# Patient Record
Sex: Male | Born: 1990 | Race: Black or African American | Hispanic: No | Marital: Married | Smoking: Never smoker
Health system: Southern US, Community
[De-identification: ages and names within clinical notes are randomized; demographics above are authoritative.]

## PROBLEM LIST (undated history)

## (undated) DIAGNOSIS — M1990 Unspecified osteoarthritis, unspecified site: Secondary | ICD-10-CM

## (undated) DIAGNOSIS — Z8669 Personal history of other diseases of the nervous system and sense organs: Secondary | ICD-10-CM

## (undated) DIAGNOSIS — J45909 Unspecified asthma, uncomplicated: Secondary | ICD-10-CM

## (undated) HISTORY — DX: Personal history of other diseases of the nervous system and sense organs: Z86.69

---

## 2016-10-19 ENCOUNTER — Encounter (HOSPITAL_COMMUNITY): Payer: Self-pay

## 2016-10-19 ENCOUNTER — Emergency Department (HOSPITAL_COMMUNITY)
Admission: EM | Admit: 2016-10-19 | Discharge: 2016-10-19 | Disposition: A | Discharge status: Home / Self Care | Payer: 59 | Attending: Emergency Medicine | Admitting: Emergency Medicine

## 2016-10-19 DIAGNOSIS — Z79899 Other long term (current) drug therapy: Secondary | ICD-10-CM | POA: Insufficient documentation

## 2016-10-19 DIAGNOSIS — F17200 Nicotine dependence, unspecified, uncomplicated: Secondary | ICD-10-CM | POA: Diagnosis not present

## 2016-10-19 DIAGNOSIS — J45909 Unspecified asthma, uncomplicated: Secondary | ICD-10-CM | POA: Diagnosis not present

## 2016-10-19 DIAGNOSIS — R2231 Localized swelling, mass and lump, right upper limb: Secondary | ICD-10-CM | POA: Diagnosis present

## 2016-10-19 DIAGNOSIS — D367 Benign neoplasm of other specified sites: Secondary | ICD-10-CM

## 2016-10-19 DIAGNOSIS — L720 Epidermal cyst: Secondary | ICD-10-CM | POA: Diagnosis not present

## 2016-10-19 HISTORY — DX: Unspecified osteoarthritis, unspecified site: M19.90

## 2016-10-19 HISTORY — DX: Unspecified asthma, uncomplicated: J45.909

## 2016-10-19 NOTE — ED Triage Notes (Signed)
Pt states he has a lump in his right bicep area. Pt reports he does heavy weight lifting. Lump is painless. No redness or swelling noted. Radial pulse intact.

## 2016-10-19 NOTE — ED Provider Notes (Signed)
[facility] DEPT Provider Note   CSN: [id number] Arrival date & time: 10/19/16  1815  By signing my name below, I, Bryan Benjamin, attest that this documentation has been prepared under the direction and in the presence of non-physician practitioner, [facility] M. Janit Bern, NP. Electronically Signed: Dora Benjamin, Scribe. 10/19/2016. 7:34 PM.  History   Chief Complaint Chief Complaint  Patient presents with  . Mass    The history is provided by the patient. No language interpreter was used.     HPI Comments: Bryan Benjamin is a 26 y.o. male who presents to the Emergency Department complaining of a mass in his right bicep beginning three weeks ago. Patient reports he initially noticed the mass while stretching his right arm and it has been present since. He states the mass is not painful but does endorse some "tightness" around the mass. He states the sensation of tightness extends into his right forearm. Patient has been weightlifting regularly since he noticed his mass without any complications. No changes in ROM of right arm. No h/o the same. No h/o cysts. No IVDU. Pt denies redness, swelling, numbness/tingling, drainage from the area, or any other associated symptoms. No PCP.  Past Medical History:  Diagnosis Date  . Arthritis   . Asthma     There are no active problems to display for this patient.   History reviewed. No pertinent surgical history.     Home Medications    Prior to Admission medications   Not on File    Family History History reviewed. No pertinent family history.  Social History Social History  Substance Use Topics  . Smoking status: Light Tobacco Smoker  . Smokeless tobacco: Never Used     Comment: rare  . Alcohol use Yes     Comment: rare     Allergies   Patient has no known allergies.   Review of Systems Review of Systems  Constitutional: Negative for chills and fever.  HENT: Negative.   Eyes: Negative for redness.  Respiratory: Negative  for shortness of breath.   Cardiovascular: Negative for chest pain.  Gastrointestinal: Negative for abdominal pain.  Musculoskeletal: Negative for arthralgias, joint swelling and myalgias.       Area to right upper arm that is raised but not painful.  Skin: Negative for color change.  Neurological: Negative for numbness.  Hematological: Negative for adenopathy.  Psychiatric/Behavioral: The patient is not nervous/anxious.    Physical Exam Updated Vital Signs Pulse 75   Temp 98.5 F (36.9 C)   Resp 18   SpO2 97%   Physical Exam  Constitutional: He is oriented to person, place, and time. He appears well-developed and well-nourished. No distress.  HENT:  Head: Normocephalic and atraumatic.  Eyes: Conjunctivae are normal.  Neck: Neck supple.  Cardiovascular: Normal rate.   Pulses:      Radial pulses are 2+ on the right side, and 2+ on the left side.  Pulmonary/Chest: Effort normal. No respiratory distress.  Musculoskeletal: Normal range of motion.  4 x 3 cm raised cystic area to the distal right biceps. No tenderness on palpation.  Neurological: He is alert and oriented to person, place, and time. He has normal reflexes.  Reflex Scores:      Bicep reflexes are 2+ on the right side and 2+ on the left side.      Brachioradialis reflexes are 2+ on the right side and 2+ on the left side. Skin: Skin is warm and dry.  Psychiatric: He has a normal  mood and affect. His behavior is normal.  Nursing note and vitals reviewed.  ED Treatments / Results  Labs (all labs ordered are listed, but only abnormal results are displayed) Labs Reviewed - No data to display   Radiology No results found.  Procedures Procedures (including critical care time)  DIAGNOSTIC STUDIES: Oxygen Saturation is 97% on RA, normal by my interpretation.    COORDINATION OF CARE: 7:43 PM Discussed treatment plan with pt at bedside and pt agreed to plan.  Medications Ordered in ED Medications - No data to  display   Initial Impression / Assessment and Plan / ED Course  I have reviewed the triage vital signs and the nursing notes.  Final Clinical Impressions(s) / ED Diagnoses  26 y.o. male with cystic area to the right upper arm stable for d/c without fever, pain, or other signs of infection. Patient given referral to dermatology for f/u.   Final diagnoses:  Cyst, dermoid, arm, right    New Prescriptions There are no discharge medications for this patient.  I personally performed the services described in this documentation, which was scribed in my presence. The recorded information has been reviewed and is accurate.    [address] [place], [place] 10/21/16 [place], MD 10/23/16 [phone]

## 2016-10-19 NOTE — Discharge Instructions (Signed)
Your exam today shows that you have a cystic area to the right upper arm. Call Dr. Juel Burrow office to schedule a follow up appointment for further evaluation of the area. If the area becomes red, you develop fever, red steaks or other problems, return here.

## 2019-06-15 ENCOUNTER — Other Ambulatory Visit: Payer: Self-pay

## 2019-06-15 ENCOUNTER — Ambulatory Visit: Payer: 59 | Admitting: Family Medicine

## 2019-06-15 ENCOUNTER — Encounter: Payer: Self-pay | Admitting: Family Medicine

## 2019-06-15 VITALS — BP 122/82 | HR 53 | Temp 97.2°F | Ht 71.5 in | Wt 249.8 lb

## 2019-06-15 DIAGNOSIS — Z113 Encounter for screening for infections with a predominantly sexual mode of transmission: Secondary | ICD-10-CM | POA: Diagnosis not present

## 2019-06-15 DIAGNOSIS — J302 Other seasonal allergic rhinitis: Secondary | ICD-10-CM

## 2019-06-15 DIAGNOSIS — G510 Bell's palsy: Secondary | ICD-10-CM | POA: Diagnosis not present

## 2019-06-15 NOTE — Patient Instructions (Signed)
Bell Palsy, Adult  Bell palsy is a short-term inability to move muscles in part of the face. The inability to move (paralysis) results from inflammation or compression of the facial nerve, which travels along the skull and under the ear to the side of the face (7th cranial nerve). This nerve is responsible for facial movements that include blinking, closing the eyes, smiling, and frowning. What are the causes? The exact cause of this condition is not known. It may be caused by an infection from a virus, such as the chickenpox (herpes zoster), Epstein-Barr, or mumps virus. What increases the risk? You are more likely to develop this condition if:  You are pregnant.  You have diabetes.  You have had a recent infection in your nose, throat, or airways (upper respiratory infection).  You have a weakened body defense system (immune system).  You have had a facial injury, such as a fracture.  You have a family history of Bell palsy. What are the signs or symptoms? Symptoms of this condition include:  Weakness on one side of the face.  Drooping eyelid and corner of the mouth.  Excessive tearing in one eye.  Difficulty closing the eyelid.  Dry eye.  Drooling.  Dry mouth.  Changes in taste.  Change in facial appearance.  Pain behind one ear.  Ringing in one or both ears.  Sensitivity to sound in one ear.  Facial twitching.  Headache.  Impaired speech.  Dizziness.  Difficulty eating or drinking. Most of the time, only one side of the face is affected. Rarely, Bell palsy affects the whole face. How is this diagnosed? This condition is diagnosed based on:  Your symptoms.  Your medical history.  A physical exam. You may also have to see health care providers who specialize in disorders of the nerves (neurologist) or diseases and conditions of the eye (ophthalmologist). You may have tests, such as:  A test to check for nerve damage (electromyogram).  Imaging  studies, such as CT or MRI scans.  Blood tests. How is this treated? This condition affects every person differently. Sometimes symptoms go away without treatment within a couple weeks. If treatment is needed, it varies from person to person. The goal of treatment is to reduce inflammation and protect the eye from damage. Treatment for Bell palsy may include:  Medicines, such as: ? Steroids to reduce swelling and inflammation. ? Antiviral drugs. ? Pain relievers, including aspirin, acetaminophen, or ibuprofen.  Eye drops or ointment to keep your eye moist.  Eye protection, if you cannot close your eye.  Exercises or massage to regain muscle strength and function (physical therapy). Follow these instructions at home:   Take over-the-counter and prescription medicines only as told by your health care provider.  If your eye is affected: ? Keep your eye moist with eye drops or ointment as told by your health care provider. ? Follow instructions for eye care and protection as told by your health care provider.  Do any physical therapy exercises as told by your health care provider.  Keep all follow-up visits as told by your health care provider. This is important. Contact a health care provider if:  You have a fever.  Your symptoms do not get better within 2-3 weeks, or your symptoms get worse.  Your eye is red, irritated, or painful.  You have new symptoms. Get help right away if:  You have weakness or numbness in a part of your body other than your face.  You have   trouble swallowing.  You develop neck pain or stiffness.  You develop dizziness or shortness of breath. Summary  Bell palsy is a short-term inability to move muscles in part of the face. The inability to move (paralysis) results from inflammation or compression of the facial nerve.  This condition affects every person differently. Sometimes symptoms go away without treatment within a couple weeks.  If  treatment is needed, it varies from person to person. The goal of treatment is to reduce inflammation and protect the eye from damage.  Contact your health care provider if your symptoms do not get better within 2-3 weeks, or your symptoms get worse. This information is not intended to replace advice given to you by your health care provider. Make sure you discuss any questions you have with your health care provider. Document Released: 06/15/2005 Document Revised: 05/28/2017 Document Reviewed: 08/18/2016 Elsevier Patient Education  2020 [employer].

## 2019-06-15 NOTE — Progress Notes (Signed)
Subjective:    Patient ID: Bryan Benjamin, male    DOB: 10/10/1990, 28 y.o.   MRN: [id number]  HPI Chief Complaint  Patient presents with  . new pt    new pt get est, thinks he might bell's palsy. started developed 2 weeks ago, droopling, swollen lymphs, pain, went to ER was given medrol dosepak but they didn't know what was wrong   He is new to the practice. Here with complaints of a 2 week history of right sided facial paralysis that seems to be improving. He has also had right ear pain and pain to the right side of his face and head. He is no longer needing medication and states pain is improving.  States he went to an UC after having enlarged lymph nodes on the right side of his neck and what he thought was his usual allergies. He was prescribed oral steroids which he completed.  States he started having issues closing his eye and was unable to move the muscles in his forehead, right nostril or the right side of his mouth. States his lymph nodes are no longer painful or swollen.  Reports his right eye is closing better and clear drainage is not as bad. He has been using drops to keep his eye moist.  States he has been doing research to figure out what is going on with his symptoms.     He denies ever having a rash.   Denies fever, chills, night sweats, dizziness, vision changes, sore throat, chest pain, palpitations, cough, shortness of breath, abdominal pain, N/V/D.   Allergies to ragweed and pollen. Takes allergy medication as needed.  Childhood asthma. No issues as an adult.   Denies any other significant PMH or PSH.  Does not eat pork.   He smokes marihuana daily. No cigarette smoking. Rarely drinks alcohol.  Married with one 30 year old.  Works in Engineer, technical sales.   Reviewed allergies, medications, past medical, surgical, family, and social history.  Review of Systems Pertinent positives and negatives in the history of present illness.     Objective:   Physical Exam Constitutional:      General: He is not in acute distress.    Appearance: He is not ill-appearing.  HENT:     Right Ear: Tympanic membrane, ear canal and external ear normal. No mastoid tenderness.     Left Ear: Tympanic membrane, ear canal and external ear normal.     Mouth/Throat:     Lips: Pink.     Mouth: Mucous membranes are moist. No oral lesions.     Tongue: No lesions. Tongue does not deviate from midline.     Pharynx: Oropharynx is clear. Uvula midline.  Eyes:     General: Vision grossly intact. Gaze aligned appropriately.     Extraocular Movements: Extraocular movements intact.     Conjunctiva/sclera: Conjunctivae normal.     Comments: Mild right upper eye lid lag  Neck:     Thyroid: No thyromegaly.     Trachea: Trachea and phonation normal.  Cardiovascular:     Rate and Rhythm: Normal rate.     Pulses: Normal pulses.     Heart sounds: Normal heart sounds.  Pulmonary:     Effort: Pulmonary effort is normal.     Breath sounds: Normal breath sounds.  Musculoskeletal:     Cervical back: Normal range of motion and neck supple. No tenderness.     Right lower leg: No edema.     Left lower leg:  No edema.  Lymphadenopathy:     Cervical: No cervical adenopathy.     Right cervical: No superficial cervical adenopathy.    Left cervical: No superficial cervical adenopathy.  Skin:    General: Skin is warm and dry.     Capillary Refill: Capillary refill takes less than 2 seconds.     Coloration: Skin is not pale.     Findings: No rash.  Neurological:     Mental Status: He is alert and oriented to person, place, and time.     Sensory: Sensation is intact.     Gait: Gait is intact.     Comments: Right sided facial paralysis including inability to raise  His right eyebrow, no wrinkles to right forehead, right sided mouth droop with right eyelid lag which is mild   Psychiatric:        Attention and Perception: Attention normal.        Mood and Affect: Mood normal.        Speech: Speech normal.          Behavior: Behavior normal.        Thought Content: Thought content normal.        Cognition and Memory: Cognition normal.    BP 122/82   Pulse (!) 53   Temp (!) 97.2 F (36.2 C)   Ht 5' 11.5" (1.816 m)   Wt 249 lb 12.8 oz (113.3 kg)   BMI 34.35 kg/m       Assessment & Plan:  Facial paralysis/Bells palsy - Plan: CBC with Differential, Comprehensive metabolic panel, HIV antibody (with reflex), RPR, GC/Chlamydia Probe Amp  Screen for STD (sexually transmitted disease) - Plan: HIV antibody (with reflex), RPR, GC/Chlamydia Probe Amp  Seasonal allergies  He is a pleasant 28 year old male who is new to the practice and here today to establish care. He presents with a 2 week history of gradually improving symptoms which appear to be consistent with Bells palsy. Dr. Redmond School also examined patient. Discussed that I expect his symptoms to continue to improve with time. He is no longer needed pain medication.  Discussed unclear as to why he developed this condition and I recommend labs to look for an underlying immune issue. He agrees. We will do a full screening for STDs.  Advised him to follow up if worsening or if he does not continue to improve. He may return at his convenience otherwise for a CPE since he is overdue.

## 2019-06-16 LAB — COMPREHENSIVE METABOLIC PANEL
ALT: 61 IU/L — ABNORMAL HIGH (ref 0–44)
AST: 25 IU/L (ref 0–40)
Albumin/Globulin Ratio: 1.6 (ref 1.2–2.2)
Albumin: 4.4 g/dL (ref 4.1–5.2)
Alkaline Phosphatase: 59 IU/L (ref 39–117)
BUN/Creatinine Ratio: 6 — ABNORMAL LOW (ref 9–20)
BUN: 8 mg/dL (ref 6–20)
Bilirubin Total: 0.4 mg/dL (ref 0.0–1.2)
CO2: 26 mmol/L (ref 20–29)
Calcium: 9.5 mg/dL (ref 8.7–10.2)
Chloride: 100 mmol/L (ref 96–106)
Creatinine, Ser: 1.26 mg/dL (ref 0.76–1.27)
GFR calc Af Amer: 88 mL/min/{1.73_m2} (ref >59)
GFR calc non Af Amer: 76 mL/min/{1.73_m2} (ref >59)
Globulin, Total: 2.7 g/dL (ref 1.5–4.5)
Glucose: 87 mg/dL (ref 65–99)
Potassium: 4.3 mmol/L (ref 3.5–5.2)
Sodium: 137 mmol/L (ref 134–144)
Total Protein: 7.1 g/dL (ref 6.0–8.5)

## 2019-06-16 LAB — CBC WITH DIFFERENTIAL/PLATELET
Basophils Absolute: 0 10*3/uL (ref 0.0–0.2)
Basos: 0 %
EOS (ABSOLUTE): 0.1 10*3/uL (ref 0.0–0.4)
Eos: 1 %
Hematocrit: 45.2 % (ref 37.5–51.0)
Hemoglobin: 15.4 g/dL (ref 13.0–17.7)
Immature Grans (Abs): 0 10*3/uL (ref 0.0–0.1)
Immature Granulocytes: 0 %
Lymphocytes Absolute: 2.6 10*3/uL (ref 0.7–3.1)
Lymphs: 49 %
MCH: 26.7 pg (ref 26.6–33.0)
MCHC: 34.1 g/dL (ref 31.5–35.7)
MCV: 78 fL — ABNORMAL LOW (ref 79–97)
Monocytes Absolute: 0.4 10*3/uL (ref 0.1–0.9)
Monocytes: 7 %
Neutrophils Absolute: 2.4 10*3/uL (ref 1.4–7.0)
Neutrophils: 43 %
Platelets: 307 10*3/uL (ref 150–450)
RBC: 5.77 x10E6/uL (ref 4.14–5.80)
RDW: 14.6 % (ref 11.6–15.4)
WBC: 5.5 10*3/uL (ref 3.4–10.8)

## 2019-06-16 LAB — HIV ANTIBODY (ROUTINE TESTING W REFLEX): HIV Screen 4th Generation wRfx: NONREACTIVE

## 2019-06-16 LAB — RPR: RPR Ser Ql: NONREACTIVE

## 2019-06-16 LAB — GC/CHLAMYDIA PROBE AMP
Chlamydia trachomatis, NAA: NEGATIVE
Neisseria Gonorrhoeae by PCR: NEGATIVE

## 2019-06-19 ENCOUNTER — Other Ambulatory Visit: Payer: Self-pay | Admitting: Internal Medicine

## 2019-06-19 DIAGNOSIS — R748 Abnormal levels of other serum enzymes: Secondary | ICD-10-CM

## 2019-06-27 ENCOUNTER — Other Ambulatory Visit: Payer: Self-pay | Admitting: Internal Medicine

## 2019-06-27 ENCOUNTER — Other Ambulatory Visit: Payer: 59

## 2019-06-27 ENCOUNTER — Other Ambulatory Visit: Payer: Self-pay

## 2019-06-27 DIAGNOSIS — R748 Abnormal levels of other serum enzymes: Secondary | ICD-10-CM

## 2019-06-28 LAB — COMPREHENSIVE METABOLIC PANEL
ALT: 51 IU/L — ABNORMAL HIGH (ref 0–44)
AST: 21 IU/L (ref 0–40)
Albumin/Globulin Ratio: 1.9 (ref 1.2–2.2)
Albumin: 4.6 g/dL (ref 4.1–5.2)
Alkaline Phosphatase: 59 IU/L (ref 39–117)
BUN/Creatinine Ratio: 4 — ABNORMAL LOW (ref 9–20)
BUN: 6 mg/dL (ref 6–20)
Bilirubin Total: 0.4 mg/dL (ref 0.0–1.2)
CO2: 23 mmol/L (ref 20–29)
Calcium: 9.7 mg/dL (ref 8.7–10.2)
Chloride: 100 mmol/L (ref 96–106)
Creatinine, Ser: 1.36 mg/dL — ABNORMAL HIGH (ref 0.76–1.27)
GFR calc Af Amer: 80 mL/min/{1.73_m2} (ref >59)
GFR calc non Af Amer: 69 mL/min/{1.73_m2} (ref >59)
Globulin, Total: 2.4 g/dL (ref 1.5–4.5)
Glucose: 83 mg/dL (ref 65–99)
Potassium: 4.6 mmol/L (ref 3.5–5.2)
Sodium: 139 mmol/L (ref 134–144)
Total Protein: 7 g/dL (ref 6.0–8.5)

## 2019-06-28 NOTE — Progress Notes (Signed)
His liver and kidney functions are mildly elevated but his liver function has improved slightly. How are his Bells Palsy symptoms? Improving? We will recheck his labs at his follow up appointment. If he is not continuing to improve or if he is having any new symptoms, we can get him in sooner.

## 2019-07-08 LAB — HEPATITIS PANEL, ACUTE
Hep A IgM: NEGATIVE
Hep B C IgM: NEGATIVE
Hep C Virus Ab: <0.1 s/co ratio (ref 0.0–0.9)
Hepatitis B Surface Ag: NEGATIVE

## 2019-07-08 LAB — SPECIMEN STATUS REPORT

## 2019-07-18 DIAGNOSIS — R748 Abnormal levels of other serum enzymes: Secondary | ICD-10-CM | POA: Insufficient documentation

## 2019-07-18 DIAGNOSIS — Z8669 Personal history of other diseases of the nervous system and sense organs: Secondary | ICD-10-CM | POA: Insufficient documentation

## 2019-07-18 DIAGNOSIS — R7989 Other specified abnormal findings of blood chemistry: Secondary | ICD-10-CM | POA: Insufficient documentation

## 2019-07-18 NOTE — Progress Notes (Deleted)
   Subjective:    Patient ID: Bryan Benjamin, male    DOB: 04-Oct-1990, 29 y.o.   MRN: [id number]  HPI No chief complaint on file.  He is fairly new to the practice and here for a complete physical exam. Previous medical care: Last CPE:  Other providers:  Past medical history: Surgeries:  Family history: Mental health history:  Social history: Lives with ***, works as ***,  *** Smoking, drinking alcohol, drug use Diet: *** Exercise: ***  Immunizations:  Health maintenance:  Colonoscopy: Last PSA: Last Dental Exam: Last Eye Exam:  Wears seatbelt always, uses sunscreen, smoke detectors in home and functioning, does not text while driving, feels safe in home environment.  Reviewed allergies, medications, past medical, surgical, family, and social history.   Review of Systems Review of Systems Constitutional: -fever, -chills, -sweats, -unexpected weight change,-fatigue ENT: -runny nose, -ear pain, -sore throat Cardiology:  -chest pain, -palpitations, -edema Respiratory: -cough, -shortness of breath, -wheezing Gastroenterology: -abdominal pain, -nausea, -vomiting, -diarrhea, -constipation  Hematology: -bleeding or bruising problems Musculoskeletal: -arthralgias, -myalgias, -joint swelling, -back pain Ophthalmology: -vision changes Urology: -dysuria, -difficulty urinating, -hematuria, -urinary frequency, -urgency Neurology: -headache, -weakness, -tingling, -numbness       Objective:   Physical Exam There were no vitals taken for this visit.  General Appearance:    Alert, cooperative, no distress, appears stated age  Head:    Normocephalic, without obvious abnormality, atraumatic  Eyes:    PERRL, conjunctiva/corneas clear, EOM's intact, fundi    benign  Ears:    Normal TM's and external ear canals  Nose:   Nares normal, mucosa normal, no drainage or sinus   tenderness  Throat:   Lips, mucosa, and tongue normal; teeth and gums normal  Neck:   Supple, no  lymphadenopathy;  thyroid:  no   enlargement/tenderness/nodules; no carotid   bruit or JVD  Back:    Spine nontender, no curvature, ROM normal, no CVA     tenderness  Lungs:     Clear to auscultation bilaterally without wheezes, rales or     ronchi; respirations unlabored  Chest Wall:    No tenderness or deformity   Heart:    Regular rate and rhythm, S1 and S2 normal, no murmur, rub   or gallop  Breast Exam:    No chest wall tenderness, masses or gynecomastia  Abdomen:     Soft, non-tender, nondistended, normoactive bowel sounds,    no masses, no hepatosplenomegaly  Genitalia:    Normal male external genitalia without lesions.  Testicles without masses.  No inguinal hernias.  Rectal:   Deferred due to age <40 and lack of symptoms  Extremities:   No clubbing, cyanosis or edema  Pulses:   2+ and symmetric all extremities  Skin:   Skin color, texture, turgor normal, no rashes or lesions  Lymph nodes:   Cervical, supraclavicular, and axillary nodes normal  Neurologic:   CNII-XII intact, normal strength, sensation and gait; reflexes 2+ and symmetric throughout          Psych:   Normal mood, affect, hygiene and grooming.         Assessment & Plan:  Routine general medical examination at a health care facility  Elevated liver enzymes  Elevated serum creatinine  H/O Bell's palsy

## 2019-07-19 ENCOUNTER — Encounter: Payer: 59 | Admitting: Family Medicine

## 2019-08-02 ENCOUNTER — Encounter: Payer: Self-pay | Admitting: Family Medicine

## 2019-08-02 ENCOUNTER — Other Ambulatory Visit: Payer: Self-pay

## 2019-08-02 ENCOUNTER — Ambulatory Visit (INDEPENDENT_AMBULATORY_CARE_PROVIDER_SITE_OTHER): Payer: 59 | Admitting: Family Medicine

## 2019-08-02 VITALS — BP 124/88 | HR 77 | Ht 71.5 in | Wt 242.5 lb

## 2019-08-02 DIAGNOSIS — Z8669 Personal history of other diseases of the nervous system and sense organs: Secondary | ICD-10-CM | POA: Diagnosis not present

## 2019-08-02 DIAGNOSIS — Z0000 Encounter for general adult medical examination without abnormal findings: Secondary | ICD-10-CM

## 2019-08-02 DIAGNOSIS — Z13220 Encounter for screening for lipoid disorders: Secondary | ICD-10-CM | POA: Diagnosis not present

## 2019-08-02 DIAGNOSIS — R748 Abnormal levels of other serum enzymes: Secondary | ICD-10-CM | POA: Diagnosis not present

## 2019-08-02 DIAGNOSIS — M7989 Other specified soft tissue disorders: Secondary | ICD-10-CM

## 2019-08-02 NOTE — Patient Instructions (Addendum)
Dermatology offices  [facility]: Phone #: [phone] Address: [address], [place], [place] [address]  [facility]: Phone: [phone]  Address: [address] [place], [place] [address]  [facility] Address: [address] [place], [place], [place] [address] Phone: [phone]  You can call and schedule your Dentist appointment at any of the following offices:   [facility] Address: [address]  [place], [place] [address] Phone #: [phone]  [facility] Address: [address] [place], [place] [address] Phone # [phone]  [facility]  Address: [address]                                                                 [place], [place] [address] Phone #: [phone]    Preventive Care 18-72 Years Old, Male Preventive care refers to lifestyle choices and visits with your health care provider that can promote health and wellness. This includes:  A yearly physical exam. This is also called an annual well check.  Regular dental and eye exams.  Immunizations.  Screening for certain conditions.  Healthy lifestyle choices, such as eating a healthy diet, getting regular exercise, not using drugs or products that contain nicotine and tobacco, and limiting alcohol use. What can I expect for my preventive care visit? Physical exam Your health care provider will check:  Height and weight. These may be used to calculate body mass index (BMI), which is a measurement that tells if you are at a healthy weight.  Heart rate and blood pressure.  Your skin for abnormal spots. Counseling Your health care provider may ask you questions about:  Alcohol, tobacco, and drug use.  Emotional well-being.  Home and relationship well-being.  Sexual activity.  Eating habits.  Work and work Statistician. What immunizations do I need?  Influenza (flu)  vaccine  This is recommended every year. Tetanus, diphtheria, and pertussis (Tdap) vaccine  You may need a Td booster every 10 years. Varicella (chickenpox) vaccine  You may need this vaccine if you have not already been vaccinated. Human papillomavirus (HPV) vaccine  If recommended by your health care provider, you may need three doses over 6 months. Measles, mumps, and rubella (MMR) vaccine  You may need at least one dose of MMR. You may also need a second dose. Meningococcal conjugate (MenACWY) vaccine  One dose is recommended if you are 48-20 years old and a Market researcher living in a residence hall, or if you have one of several medical conditions. You may also need additional booster doses. Pneumococcal conjugate (PCV13) vaccine  You may need this if you have certain conditions and were not previously vaccinated. Pneumococcal polysaccharide (PPSV23) vaccine  You may need one or two doses if you smoke cigarettes or if you have certain conditions. Hepatitis A vaccine  You may need this if you have certain conditions or if you travel or work in places where you may be exposed to hepatitis A. Hepatitis B vaccine  You may need this if you have certain conditions or if you travel or work in places where you may be exposed to hepatitis B. Haemophilus influenzae type b (Hib) vaccine  You may need this if you have certain risk factors.  You may receive vaccines as individual doses or as more than one vaccine together in one shot (combination vaccines). Talk with your health care provider about the risks and benefits of combination vaccines. What tests do I need? Blood tests  Lipid and cholesterol levels. These may be checked every 5 years starting at age 82.  Hepatitis C test.  Hepatitis B test. Screening   Diabetes screening. This is done by checking your blood sugar (glucose) after you have not eaten for a while (fasting).  Sexually transmitted disease (STD)  testing. Talk with your health care provider about your test results, treatment options, and if necessary, the need for more tests. Follow these instructions at home: Eating and drinking   Eat a diet that includes fresh fruits and vegetables, whole grains, lean protein, and low-fat dairy products.  Take vitamin and mineral supplements as recommended by your health care provider.  Do not drink alcohol if your health care provider tells you not to drink.  If you drink alcohol: ? Limit how much you have to 0-2 drinks a day. ? Be aware of how much alcohol is in your drink. In the [place] one drink equals one 12 oz bottle of beer (355 mL), one 5 oz glass of wine (148 mL), or one 1 oz glass of hard liquor (44 mL). Lifestyle  Take daily care of your teeth and gums.  Stay active. Exercise for at least 30 minutes on 5 or more days each week.  Do not use any products that contain nicotine or tobacco, such as cigarettes, e-cigarettes, and chewing tobacco. If you need help quitting, ask your health care provider.  If you are sexually active, practice safe sex. Use a condom or other form of protection to prevent STIs (sexually transmitted infections). What's next?  Go to your health care provider once a year for a well check visit.  Ask your health care provider how often you should have your eyes and teeth checked.  Stay up to date on all vaccines. This information is not intended to replace advice given to you by your health care provider. Make sure you discuss any questions you have with your health care provider. Document Revised: 06/09/2018 Document Reviewed: 06/09/2018 Elsevier Patient Education  2020 [employer].

## 2019-08-02 NOTE — Progress Notes (Addendum)
Subjective:    Patient ID: Bryan Benjamin, male    DOB: September 29, 1990, 29 y.o.   MRN: [id number]  HPI Chief Complaint  Patient presents with  . fasting cpe    fasting cpe, no other concerns   He is fairly new to the practice and here for a complete physical exam. Last CPE: years ago   Complains of a mass on his right upper arm that he would like to do something about. States it is not painful but is more of a cosmetic issue.   Bells Palsy - states he is basically back to baseline.   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   Elevated serum creatinine at his previous visit as well as elevated liver enzyme and negative acute hepatitis panel.  No regular alcohol use but states he made a rum cake 2 days ago. Has been eating it. No NSAIDs.   Reports hx of asthma and no issues since age 7.    He smokes marijuana daily. No cigarette smoking. Rarely drinks alcohol.  Married with one 31 year old.  Works in Engineer, technical sales.   Diet: healthy. Does not eat pork.  Exercise: 6 days per week   Immunizations: declines   Health maintenance:  Colonoscopy: N/A Last PSA: N/A Last Dental Exam: years ago Last Eye Exam:  Years ago   Wears seatbelt always, smoke detectors in home and functioning, does not text while driving, feels safe in home environment.  Reviewed allergies, medications, past medical, surgical, family, and social history.    Review of Systems Review of Systems Constitutional: -fever, -chills, -sweats, -unexpected weight change,-fatigue ENT: -runny nose, -ear pain, -sore throat Cardiology:  -chest pain, -palpitations, -edema Respiratory: -cough, -shortness of breath, -wheezing Gastroenterology: -abdominal pain, -nausea, -vomiting, -diarrhea, -constipation  Hematology: -bleeding or bruising problems Musculoskeletal: -arthralgias, -myalgias, -joint swelling, -back pain Ophthalmology: -vision changes Urology: -dysuria, -difficulty urinating, -hematuria, -urinary frequency, -urgency Neurology:  -headache, -weakness, -tingling, -numbness       Objective:   Physical Exam BP 124/88   Pulse 77   Ht 5' 11.5" (1.816 m)   Wt 242 lb 8 oz (110 kg)   BMI 33.35 kg/m   General Appearance:    Alert, cooperative, no distress, appears stated age  Head:    Normocephalic, without obvious abnormality, atraumatic  Eyes:    PERRL, conjunctiva/corneas clear, EOM's intact  Ears:    Normal TM's and external ear canals  Nose:   Mask in place   Throat:   Mask in place   Neck:   Supple, no lymphadenopathy;  thyroid:  no   enlargement/tenderness/nodules; no JVD  Back:    Spine nontender, no curvature, ROM normal, no CVA     tenderness  Lungs:     Clear to auscultation bilaterally without wheezes, rales or     ronchi; respirations unlabored  Chest Wall:    No tenderness or deformity   Heart:    Regular rate and rhythm, S1 and S2 normal, no murmur, rub   or gallop  Breast Exam:    No chest wall tenderness, masses or gynecomastia  Abdomen:     Soft, non-tender, nondistended, normoactive bowel sounds,    no masses, no hepatosplenomegaly  Genitalia:    Normal male external genitalia without lesions.  Testicles without masses.  No inguinal hernias.  Rectal:   Deferred due to age <40 and lack of symptoms  Extremities:   No clubbing, cyanosis or edema  Pulses:   2+ and symmetric all extremities  Skin:  Skin color, texture, turgor normal, no rashes. He has a large raised soft and somewhat moveable mass on his anterior distal bicep just superior to the medial antecubital space.   Lymph nodes:   Cervical, supraclavicular, and axillary nodes normal  Neurologic:   CNII-XII intact, normal strength, sensation and gait; reflexes 2+ and symmetric throughout          Psych:   Normal mood, affect, hygiene and grooming.         Assessment & Plan:  Routine general medical examination at a health care facility - Plan: CBC with Differential/Platelet, Comprehensive metabolic panel, TSH, T4, free, Lipid panel -Here  today for a fasting CPE.  Discussed preventive healthcare and cancer screening particularly testicular cancer.  Immunizations reviewed.  Declines vaccines.  Discussed healthy lifestyle including healthy diet and exercise.  Advised stopping marijuana use.  H/O Bell's palsy -Reports being basically back to baseline  Elevated liver enzymes - Plan: Comprehensive metabolic panel -No regular alcohol or NSAID use.  Discussed that if his ALT is still elevated that I will order an ultrasound and he is okay with this.   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 Will refer to general surgery since he wants to have it removed.   Screening for lipid disorders - Plan: Lipid panel -Counseled on healthy diet and exercise.  Follow-up pending lab

## 2019-08-03 ENCOUNTER — Other Ambulatory Visit: Payer: Self-pay | Admitting: Internal Medicine

## 2019-08-03 DIAGNOSIS — M7989 Other specified soft tissue disorders: Secondary | ICD-10-CM

## 2019-08-03 DIAGNOSIS — R748 Abnormal levels of other serum enzymes: Secondary | ICD-10-CM

## 2019-08-03 LAB — LIPID PANEL
Chol/HDL Ratio: 4.4 ratio (ref 0.0–5.0)
Cholesterol, Total: 209 mg/dL — ABNORMAL HIGH (ref 100–199)
HDL: 47 mg/dL (ref >39)
LDL Chol Calc (NIH): 148 mg/dL — ABNORMAL HIGH (ref 0–99)
Triglycerides: 76 mg/dL (ref 0–149)
VLDL Cholesterol Cal: 14 mg/dL (ref 5–40)

## 2019-08-03 LAB — CBC WITH DIFFERENTIAL/PLATELET
Basophils Absolute: 0 10*3/uL (ref 0.0–0.2)
Basos: 0 %
EOS (ABSOLUTE): 0.1 10*3/uL (ref 0.0–0.4)
Eos: 1 %
Hematocrit: 47.8 % (ref 37.5–51.0)
Hemoglobin: 16.1 g/dL (ref 13.0–17.7)
Immature Grans (Abs): 0 10*3/uL (ref 0.0–0.1)
Immature Granulocytes: 0 %
Lymphocytes Absolute: 2.6 10*3/uL (ref 0.7–3.1)
Lymphs: 52 %
MCH: 26.7 pg (ref 26.6–33.0)
MCHC: 33.7 g/dL (ref 31.5–35.7)
MCV: 79 fL (ref 79–97)
Monocytes Absolute: 0.3 10*3/uL (ref 0.1–0.9)
Monocytes: 7 %
Neutrophils Absolute: 2 10*3/uL (ref 1.4–7.0)
Neutrophils: 40 %
Platelets: 266 10*3/uL (ref 150–450)
RBC: 6.04 x10E6/uL — ABNORMAL HIGH (ref 4.14–5.80)
RDW: 14.6 % (ref 11.6–15.4)
WBC: 5.1 10*3/uL (ref 3.4–10.8)

## 2019-08-03 LAB — COMPREHENSIVE METABOLIC PANEL
ALT: 53 IU/L — ABNORMAL HIGH (ref 0–44)
AST: 25 IU/L (ref 0–40)
Albumin/Globulin Ratio: 1.8 (ref 1.2–2.2)
Albumin: 4.5 g/dL (ref 4.0–5.0)
Alkaline Phosphatase: 56 IU/L (ref 39–117)
BUN/Creatinine Ratio: 9 (ref 9–20)
BUN: 10 mg/dL (ref 6–20)
Bilirubin Total: 0.3 mg/dL (ref 0.0–1.2)
CO2: 23 mmol/L (ref 20–29)
Calcium: 9.2 mg/dL (ref 8.7–10.2)
Chloride: 103 mmol/L (ref 96–106)
Creatinine, Ser: 1.14 mg/dL (ref 0.76–1.27)
GFR calc Af Amer: 99 mL/min/{1.73_m2} (ref >59)
GFR calc non Af Amer: 85 mL/min/{1.73_m2} (ref >59)
Globulin, Total: 2.5 g/dL (ref 1.5–4.5)
Glucose: 90 mg/dL (ref 65–99)
Potassium: 4.3 mmol/L (ref 3.5–5.2)
Sodium: 141 mmol/L (ref 134–144)
Total Protein: 7 g/dL (ref 6.0–8.5)

## 2019-08-03 LAB — T4, FREE: Free T4: 1.17 ng/dL (ref 0.82–1.77)

## 2019-08-03 LAB — TSH: TSH: 2.47 u[IU]/mL (ref 0.450–4.500)

## 2019-08-03 NOTE — Progress Notes (Signed)
His liver enzyme is still mildly elevated. I recommend an [place] to look at his liver. A RUQ [place] please. 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 If he is ok with this, please refer him. Thanks. His LDL or bad cholesterol is elevated which is a risk factor for heart disease so I recommend cutting back on fatty foods, fried foods, creamy sauces and dressings.

## 2019-08-10 ENCOUNTER — Ambulatory Visit
Admission: RE | Admit: 2019-08-10 | Discharge: 2019-08-10 | Disposition: A | Discharge status: Home / Self Care | Payer: 59 | Source: Ambulatory Visit | Attending: Family Medicine | Admitting: Family Medicine

## 2019-08-10 DIAGNOSIS — R748 Abnormal levels of other serum enzymes: Secondary | ICD-10-CM

## 2019-08-10 NOTE — Progress Notes (Signed)
Good news. His ultrasound is normal.

## 2019-09-12 ENCOUNTER — Other Ambulatory Visit: Payer: Self-pay | Admitting: Surgery

## 2020-08-02 ENCOUNTER — Encounter: Payer: 59 | Admitting: Family Medicine

## 2020-08-07 NOTE — Progress Notes (Signed)
Subjective:    Patient ID: Bryan Benjamin, male    DOB: 02-Nov-1990, 30 y.o.   MRN: [id number]  HPI Chief Complaint  Patient presents with  . cpe    Fasting cpe, no concerns   He is here for a complete physical exam.  Other providers: None   States he is having intermittent muscle twitching on the right side of his face. States it is not bothersome. History of Bell's Palsy. States he has good movement of his face.    Social history: Lives with his wife Radiographer, therapeutic) and 84 year old son, works in IT Denies smoking, drinking alcohol, drug use Diet: very healthy  Exercise: 5 days per week   Immunizations: declines flu and Covid. Tdap more than 10 yrs   Health maintenance:  Last Dental Exam: last week  Last Eye Exam: years ago   Wears seatbelt always,smoke detectors in home and functioning, does not text while driving, feels safe in home environment.  Reviewed allergies, medications, past medical, surgical, family, and social history.    Review of Systems Review of Systems Constitutional: -fever, -chills, -sweats, -unexpected weight change,-fatigue ENT: -runny nose, -ear pain, -sore throat Cardiology:  -chest pain, -palpitations, -edema Respiratory: -cough, -shortness of breath, -wheezing Gastroenterology: -abdominal pain, -nausea, -vomiting, -diarrhea, -constipation  Hematology: -bleeding or bruising problems Musculoskeletal: -arthralgias, -myalgias, -joint swelling, -back pain Ophthalmology: -vision changes Urology: -dysuria, -difficulty urinating, -hematuria, -urinary frequency, -urgency Neurology: -headache, -weakness, -tingling, -numbness       Objective:   Physical Exam BP 110/70   Pulse 66   Ht 6' (1.829 m)   Wt 225 lb 3.2 oz (102.2 kg)   BMI 30.54 kg/m   General Appearance:    Alert, cooperative, no distress, appears stated age  Head:    Normocephalic, without obvious abnormality, atraumatic  Eyes:    PERRL, conjunctiva/corneas clear, EOM's intact  Ears:     Normal TM's and external ear canals  Nose:   Mask on   Throat:   Mask on   Neck:   Supple, no lymphadenopathy;  thyroid:  no   enlargement/tenderness/nodules; no JVD  Back:    Spine nontender, no curvature, ROM normal, no CVA     tenderness  Lungs:     Clear to auscultation bilaterally without wheezes, rales or     ronchi; respirations unlabored  Chest Wall:    No tenderness or deformity   Heart:    Regular rate and rhythm, S1 and S2 normal, no murmur, rub   or gallop  Breast Exam:    No chest wall tenderness, masses or gynecomastia  Abdomen:     Soft, non-tender, nondistended, normoactive bowel sounds,    no masses, no hepatosplenomegaly  Genitalia:    Normal male external genitalia without lesions.  Testicles without masses.  No inguinal hernias.  Rectal:   Deferred due to age <40 and lack of symptoms  Extremities:   No clubbing, cyanosis or edema  Pulses:   2+ and symmetric all extremities  Skin:   Skin color, texture, turgor normal, no rashes or lesions  Lymph nodes:   Cervical, supraclavicular, and axillary nodes normal  Neurologic:   CNII-XII intact, normal strength, sensation and gait; reflexes 2+ and symmetric throughout          Psych:   Normal mood, affect, hygiene and grooming.        Assessment & Plan:  Routine general medical examination at a health care facility - Plan: CBC with Differential/Platelet, Comprehensive metabolic panel,  TSH, Lipid panel -He is not fasting today.  He had a smoothie prior to his visit. Preventive health care reviewed.  Recommend regular dental and eye exams.  We also discussed self testicular exams and his increased risk of testicular cancer due to his age.  Counseling on healthy lifestyle including diet and exercise.  He appears to be taking good care of himself.  He is in good spirits.  Immunizations reviewed.  He declines flu and Covid vaccines.  Tdap will be updated.  Discussed safety.  Elevated liver enzymes - Plan: Comprehensive metabolic  panel -Unclear etiology.  Rarely drinks alcohol.  No regular NSAIDs.  Normal appearing liver on ultrasound in February 2021.  He is eating a low-fat diet.  Check liver function tests and follow-up.  H/O Bell's palsy -Reports good facial movement and sensation but he does occasionally have twitching of the right side of his face.  States it is not bothersome.  Elevated LDL cholesterol level - Plan: Lipid panel -He is eating and healthy diet and exercising.  Follow-up pending lipid panel results  Need for diphtheria-tetanus-pertussis (Tdap) vaccine - Plan: Tdap vaccine greater than or equal to 7yo IM -Counseling done on all components of the vaccine  COVID-19 vaccination declined

## 2020-08-07 NOTE — Patient Instructions (Signed)
Preventive Care 41-32 Years Old, Male Preventive care refers to lifestyle choices and visits with your health care provider that can promote health and wellness. This includes:  A yearly physical exam. This is also called an annual wellness visit.  Regular dental and eye exams.  Immunizations.  Screening for certain conditions.  Healthy lifestyle choices, such as: ? Eating a healthy diet. ? Getting regular exercise. ? Not using drugs or products that contain nicotine and tobacco. ? Limiting alcohol use. What can I expect for my preventive care visit? Physical exam Your health care provider may check your:  Height and weight. These may be used to calculate your BMI (body mass index). BMI is a measurement that tells if you are at a healthy weight.  Heart rate and blood pressure.  Body temperature.  Skin for abnormal spots. Counseling Your health care provider may ask you questions about your:  Past medical problems.  Family's medical history.  Alcohol, tobacco, and drug use.  Emotional well-being.  Home life and relationship well-being.  Sexual activity.  Diet, exercise, and sleep habits.  Work and work Statistician.  Access to firearms. What immunizations do I need? Vaccines are usually given at various ages, according to a schedule. Your health care provider will recommend vaccines for you based on your age, medical history, and lifestyle or other factors, such as travel or where you work.   What tests do I need? Blood tests  Lipid and cholesterol levels. These may be checked every 5 years starting at age 30.  Hepatitis C test.  Hepatitis B test. Screening  Diabetes screening. This is done by checking your blood sugar (glucose) after you have not eaten for a while (fasting).  Genital exam to check for testicular cancer or hernias.  STD (sexually transmitted disease) testing, if you are at risk. Talk with your health care provider about your test results,  treatment options, and if necessary, the need for more tests.   Follow these instructions at home: Eating and drinking  Eat a healthy diet that includes fresh fruits and vegetables, whole grains, lean protein, and low-fat dairy products.  Drink enough fluid to keep your urine pale yellow.  Take vitamin and mineral supplements as recommended by your health care provider.  Do not drink alcohol if your health care provider tells you not to drink.  If you drink alcohol: ? Limit how much you have to 0-2 drinks a day. ? Be aware of how much alcohol is in your drink. In the [place] one drink equals one 12 oz bottle of beer (355 mL), one 5 oz glass of wine (148 mL), or one 1 oz glass of hard liquor (44 mL).   Lifestyle  Take daily care of your teeth and gums. Brush your teeth every morning and night with fluoride toothpaste. Floss one time each day.  Stay active. Exercise for at least 30 minutes 5 or more days each week.  Do not use any products that contain nicotine or tobacco, such as cigarettes, e-cigarettes, and chewing tobacco. If you need help quitting, ask your health care provider.  Do not use drugs.  If you are sexually active, practice safe sex. Use a condom or other form of protection to prevent STIs (sexually transmitted infections).  Find healthy ways to cope with stress, such as: ? Meditation, yoga, or listening to music. ? Journaling. ? Talking to a trusted person. ? Spending time with friends and family. Safety  Always wear your seat belt while driving  or riding in a vehicle.  Do not drive: ? If you have been drinking alcohol. Do not ride with someone who has been drinking. ? When you are tired or distracted. ? While texting.  Wear a helmet and other protective equipment during sports activities.  If you have firearms in your house, make sure you follow all gun safety procedures.  Seek help if you have been physically or sexually abused. What's next?  Go to your  health care provider once a year for an annual wellness visit.  Ask your health care provider how often you should have your eyes and teeth checked.  Stay up to date on all vaccines. This information is not intended to replace advice given to you by your health care provider. Make sure you discuss any questions you have with your health care provider. Document Revised: 03/01/2019 Document Reviewed: 06/09/2018 Elsevier Patient Education  2021 [employer].

## 2020-08-08 ENCOUNTER — Other Ambulatory Visit: Payer: Self-pay

## 2020-08-08 ENCOUNTER — Ambulatory Visit: Payer: 59 | Admitting: Family Medicine

## 2020-08-08 ENCOUNTER — Encounter: Payer: Self-pay | Admitting: Family Medicine

## 2020-08-08 VITALS — BP 110/70 | HR 66 | Ht 72.0 in | Wt 225.2 lb

## 2020-08-08 DIAGNOSIS — Z2821 Immunization not carried out because of patient refusal: Secondary | ICD-10-CM | POA: Insufficient documentation

## 2020-08-08 DIAGNOSIS — R748 Abnormal levels of other serum enzymes: Secondary | ICD-10-CM

## 2020-08-08 DIAGNOSIS — Z23 Encounter for immunization: Secondary | ICD-10-CM

## 2020-08-08 DIAGNOSIS — Z8669 Personal history of other diseases of the nervous system and sense organs: Secondary | ICD-10-CM

## 2020-08-08 DIAGNOSIS — E7800 Pure hypercholesterolemia, unspecified: Secondary | ICD-10-CM | POA: Diagnosis not present

## 2020-08-08 DIAGNOSIS — Z0000 Encounter for general adult medical examination without abnormal findings: Secondary | ICD-10-CM

## 2020-08-09 LAB — LIPID PANEL
Chol/HDL Ratio: 3.9 ratio (ref 0.0–5.0)
Cholesterol, Total: 187 mg/dL (ref 100–199)
HDL: 48 mg/dL (ref >39)
LDL Chol Calc (NIH): 126 mg/dL — ABNORMAL HIGH (ref 0–99)
Triglycerides: 67 mg/dL (ref 0–149)
VLDL Cholesterol Cal: 13 mg/dL (ref 5–40)

## 2020-08-09 LAB — COMPREHENSIVE METABOLIC PANEL
ALT: 25 IU/L (ref 0–44)
AST: 13 IU/L (ref 0–40)
Albumin/Globulin Ratio: 1.7 (ref 1.2–2.2)
Albumin: 4.5 g/dL (ref 4.0–5.0)
Alkaline Phosphatase: 58 IU/L (ref 44–121)
BUN/Creatinine Ratio: 9 (ref 9–20)
BUN: 11 mg/dL (ref 6–20)
Bilirubin Total: 0.2 mg/dL (ref 0.0–1.2)
CO2: 21 mmol/L (ref 20–29)
Calcium: 9.5 mg/dL (ref 8.7–10.2)
Chloride: 101 mmol/L (ref 96–106)
Creatinine, Ser: 1.21 mg/dL (ref 0.76–1.27)
GFR calc Af Amer: 91 mL/min/{1.73_m2} (ref >59)
GFR calc non Af Amer: 79 mL/min/{1.73_m2} (ref >59)
Globulin, Total: 2.7 g/dL (ref 1.5–4.5)
Glucose: 78 mg/dL (ref 65–99)
Potassium: 4.5 mmol/L (ref 3.5–5.2)
Sodium: 140 mmol/L (ref 134–144)
Total Protein: 7.2 g/dL (ref 6.0–8.5)

## 2020-08-09 LAB — CBC WITH DIFFERENTIAL/PLATELET
Basophils Absolute: 0 10*3/uL (ref 0.0–0.2)
Basos: 0 %
EOS (ABSOLUTE): 0 10*3/uL (ref 0.0–0.4)
Eos: 1 %
Hematocrit: 48.8 % (ref 37.5–51.0)
Hemoglobin: 16.1 g/dL (ref 13.0–17.7)
Immature Grans (Abs): 0 10*3/uL (ref 0.0–0.1)
Immature Granulocytes: 0 %
Lymphocytes Absolute: 2 10*3/uL (ref 0.7–3.1)
Lymphs: 43 %
MCH: 26.9 pg (ref 26.6–33.0)
MCHC: 33 g/dL (ref 31.5–35.7)
MCV: 82 fL (ref 79–97)
Monocytes Absolute: 0.4 10*3/uL (ref 0.1–0.9)
Monocytes: 8 %
Neutrophils Absolute: 2.2 10*3/uL (ref 1.4–7.0)
Neutrophils: 48 %
Platelets: 284 10*3/uL (ref 150–450)
RBC: 5.99 x10E6/uL — ABNORMAL HIGH (ref 4.14–5.80)
RDW: 14.1 % (ref 11.6–15.4)
WBC: 4.7 10*3/uL (ref 3.4–10.8)

## 2020-08-09 LAB — TSH: TSH: 2.06 u[IU]/mL (ref 0.450–4.500)

## 2020-08-09 NOTE — Progress Notes (Signed)
Your labs look good. Your LDL or bad cholesterol has improved significantly. Continue eating a low fat diet and getting plenty of exercise.

## 2020-12-24 IMAGING — US US ABDOMEN LIMITED
1 series · 14 of 25 positions shown · non-contrast
Comparison: None.

CLINICAL DATA: Elevated liver enzymes

EXAM:
ULTRASOUND ABDOMEN LIMITED RIGHT UPPER QUADRANT

[Series 1: us abdomen limited · 0.22mm/px · 14 of 48 slices shown]
[im 1/48]
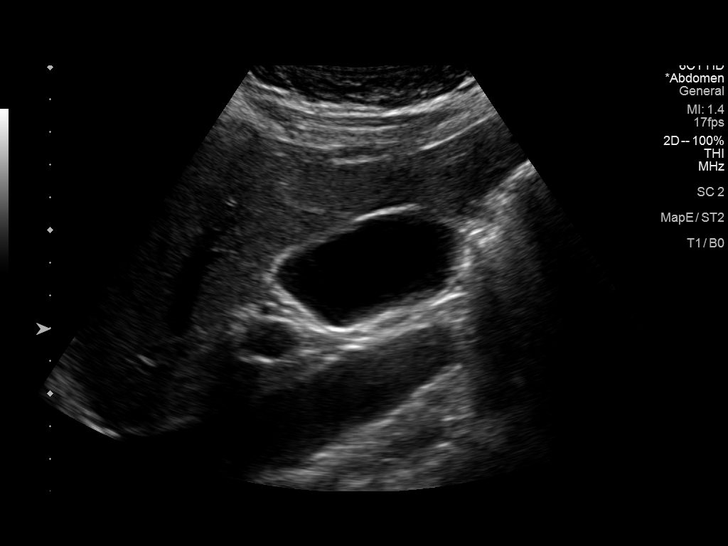
[im 4/48]
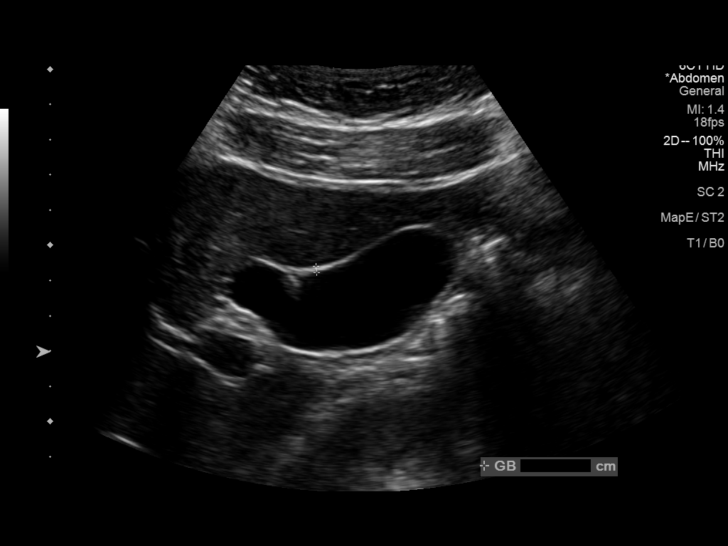
[im 8/48]
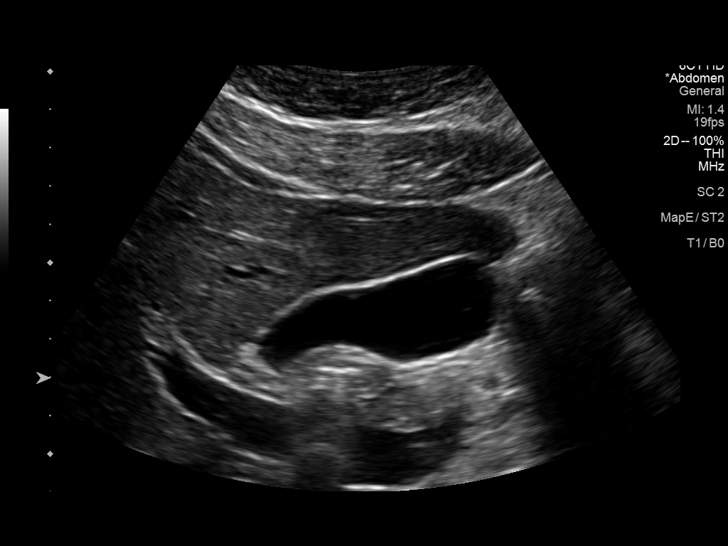
[im 12/48]
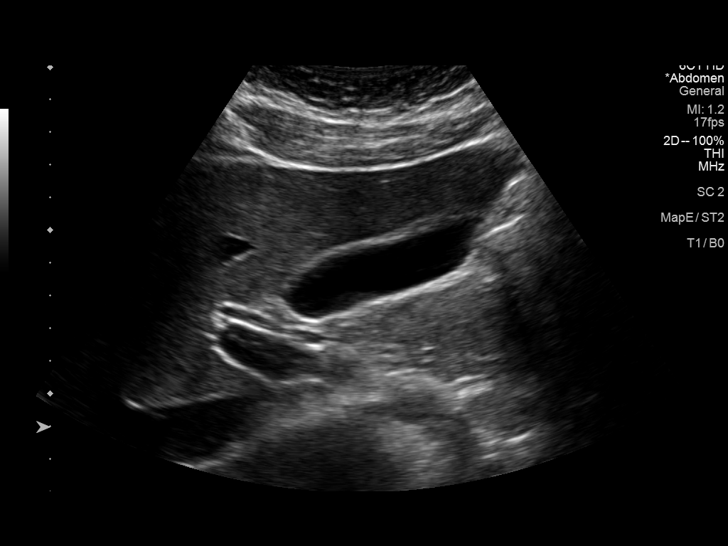
[im 16/48]
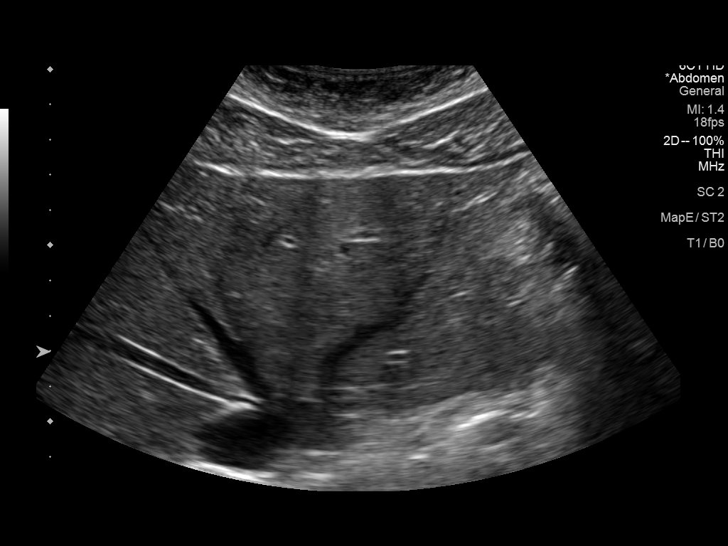
[im 18/48]
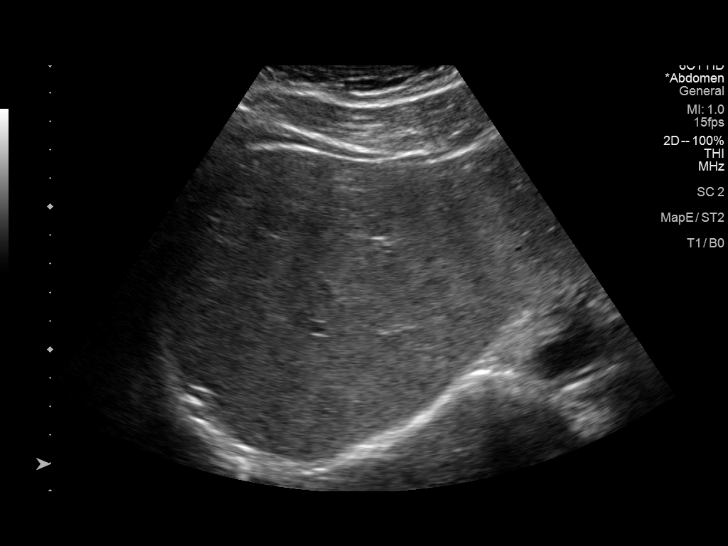
[im 22/48]
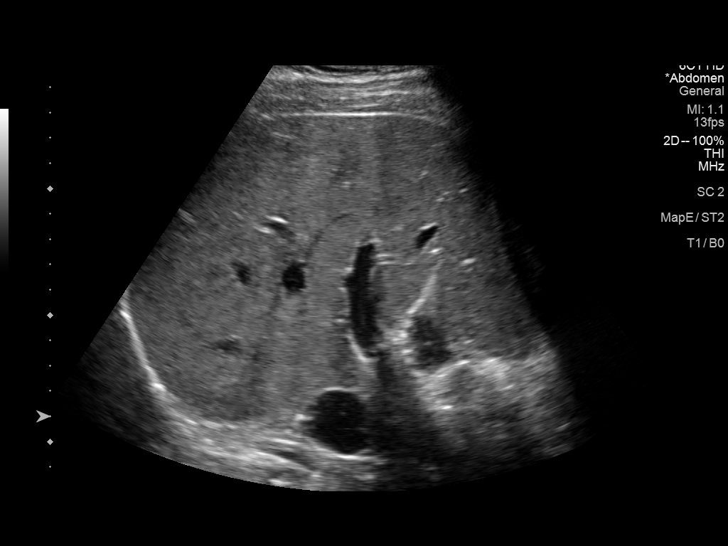
[im 26/48]
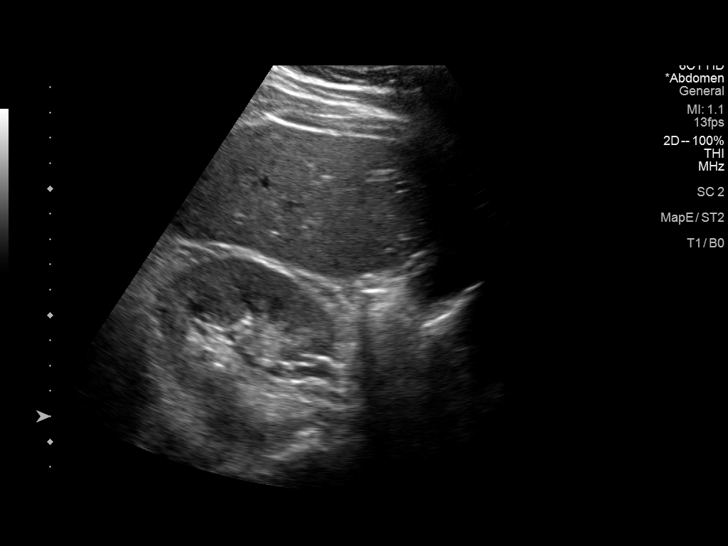
[im 30/48]
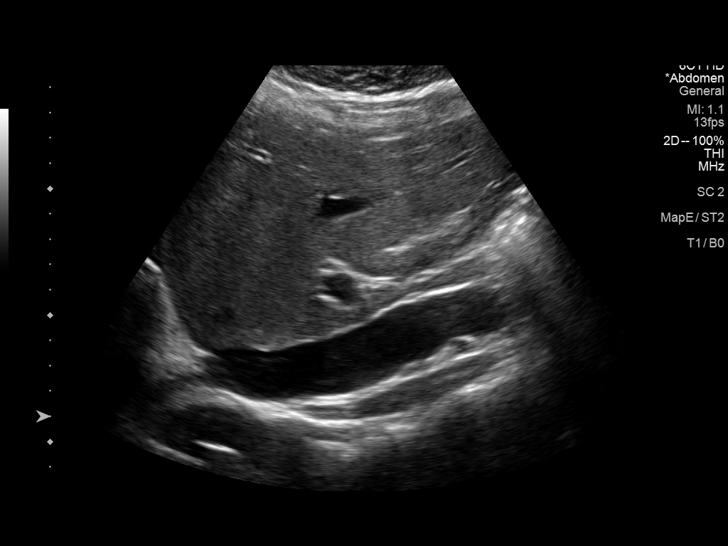
[im 32/48]
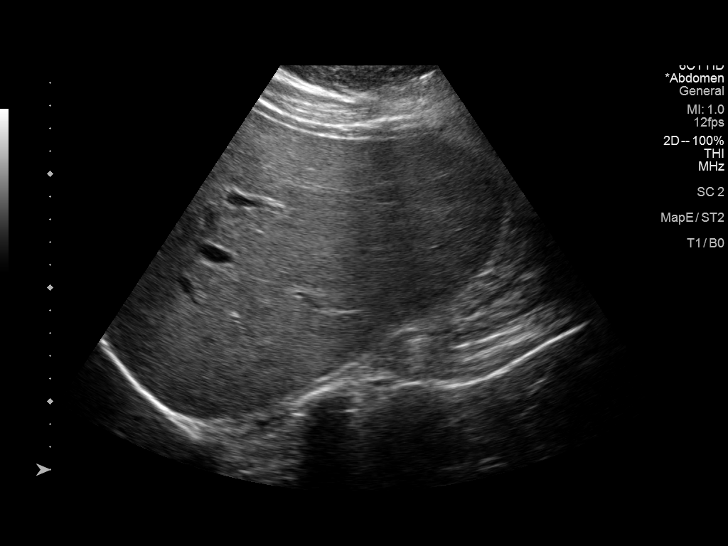
[im 36/48]
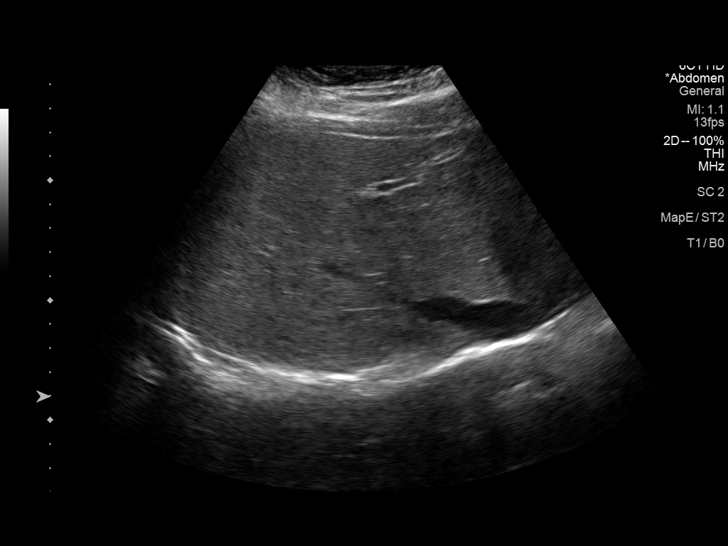
[im 40/48]
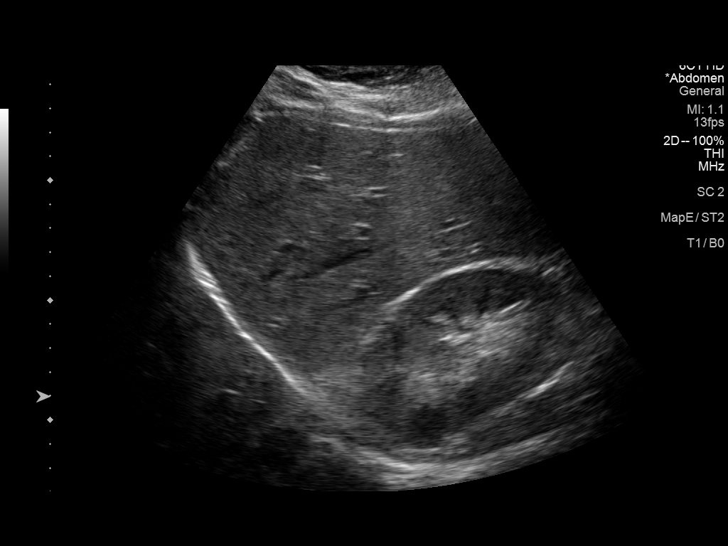
[im 44/48]
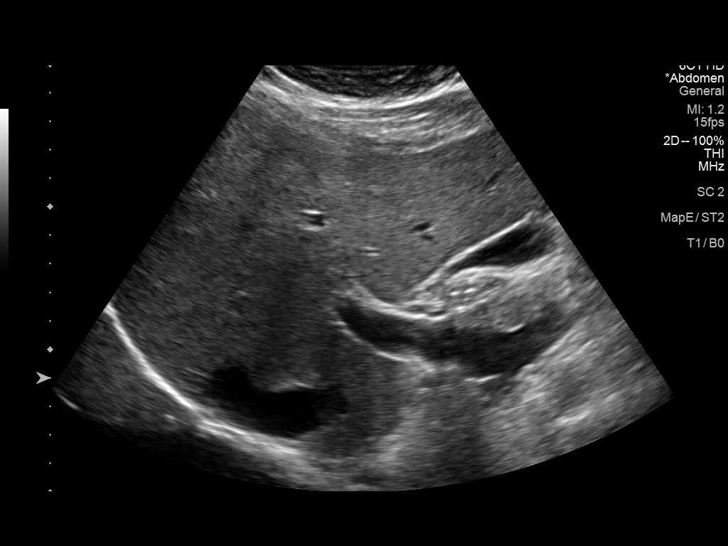
[im 48/48]
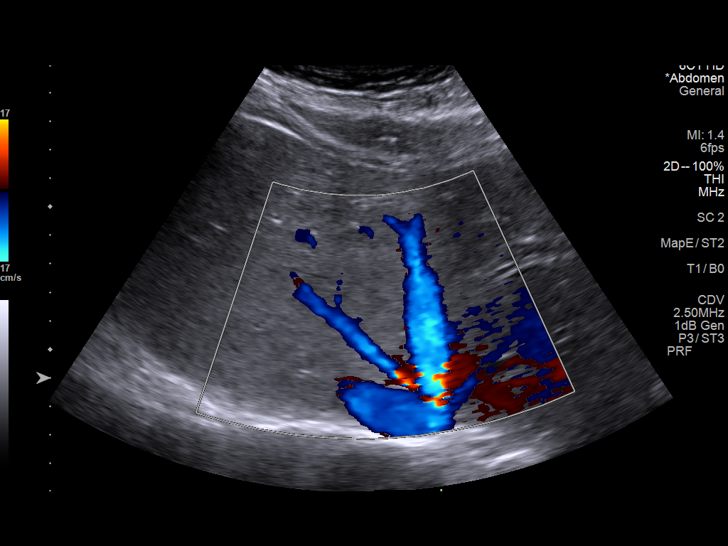

[14 of 25 positions shown; findings below may reference images not displayed]

FINDINGS: Gallbladder:

No gallstones or wall thickening visualized. There is no
pericholecystic fluid. No sonographic Murphy sign noted by
sonographer.

Common bile duct:

Diameter: 4 mm. No intrahepatic or extrahepatic biliary duct
dilatation.

Liver:

No focal lesion identified. Within normal limits in parenchymal
echogenicity. Portal vein is patent on color Doppler imaging with
normal direction of blood flow towards the liver.

Other: None.
IMPRESSION: Study within normal limits.

## 2022-05-29 HISTORY — PX: NO PAST SURGERIES: SHX2092

## 2022-06-19 ENCOUNTER — Encounter: Payer: Self-pay | Admitting: Medical

## 2022-06-19 ENCOUNTER — Ambulatory Visit (INDEPENDENT_AMBULATORY_CARE_PROVIDER_SITE_OTHER): Payer: 59 | Admitting: Medical

## 2022-06-19 VITALS — BP 124/92 | HR 66 | Temp 98.0°F | Resp 16 | Ht 70.0 in | Wt 244.0 lb

## 2022-06-19 DIAGNOSIS — Z8249 Family history of ischemic heart disease and other diseases of the circulatory system: Secondary | ICD-10-CM

## 2022-06-19 DIAGNOSIS — Z1389 Encounter for screening for other disorder: Secondary | ICD-10-CM

## 2022-06-19 DIAGNOSIS — R5383 Other fatigue: Secondary | ICD-10-CM

## 2022-06-19 DIAGNOSIS — Z0000 Encounter for general adult medical examination without abnormal findings: Secondary | ICD-10-CM

## 2022-06-19 DIAGNOSIS — Z13220 Encounter for screening for lipoid disorders: Secondary | ICD-10-CM | POA: Diagnosis not present

## 2022-06-19 LAB — POCT URINALYSIS DIP (PROADVANTAGE DEVICE)
Bilirubin, UA: NEGATIVE
Blood, UA: NEGATIVE
Glucose, UA: NEGATIVE mg/dL
Ketones, POC UA: NEGATIVE mg/dL
Leukocytes, UA: NEGATIVE
Nitrite, UA: NEGATIVE
Protein Ur, POC: NEGATIVE mg/dL
Specific Gravity, Urine: 1.015
Urobilinogen, Ur: 0.2
pH, UA: 6 (ref 5.0–8.0)

## 2022-06-19 NOTE — Progress Notes (Signed)
Subjective:   HPI  Bryan Benjamin is a 31 y.o. male who presents for Chief Complaint  Patient presents with   Annual Exam    Fasting. Would like blood work.    Patient Care Team: Girtha Rm, NP-C as PCP - General (Family Medicine) Sees dentist Sees eye doctor  Concerns: Doing well.  He does note some fatigue, wants testosterone.  Married, has 9yo son  He has history of asthma.   Last issues around 31yo in childhood.  Plays brass, tuba, saxophone.  No breathing issues playing brass  Reviewed their medical, surgical, family, social, medication, and allergy history and updated chart as appropriate.  Past Medical History:  Diagnosis Date   Arthritis    Asthma    History of Bell's palsy     Past Surgical History:  Procedure Laterality Date   NO PAST SURGERIES  05/2022    Family History  Problem Relation Age of Onset   Stroke Mother 41   Depression Mother    Diabetes Mother    Heart disease Mother 10       stents, MI   Diabetes Father    Heart disease Maternal Grandmother    Cancer Neg Hx     No current outpatient medications on file.  No Known Allergies   Review of Systems Constitutional: -fever, -chills, -sweats, -unexpected weight change, -decreased appetite, +fatigue Allergy: -sneezing, -itching, -congestion Dermatology: -changing moles, --rash, -lumps ENT: -runny nose, -ear pain, -sore throat, -hoarseness, -sinus pain, -teeth pain, - ringing in ears, -hearing loss, -nosebleeds Cardiology: -chest pain, -palpitations, -swelling, -difficulty breathing when lying flat, -waking up short of breath Respiratory: -cough, -shortness of breath, -difficulty breathing with exercise or exertion, -wheezing, -coughing up blood Gastroenterology: -abdominal pain, -nausea, -vomiting, -diarrhea, -constipation, -blood in stool, -changes in bowel movement, -difficulty swallowing or eating Hematology: -bleeding, -bruising  Musculoskeletal: -joint aches, -muscle aches,  -joint swelling, -back pain, -neck pain, -cramping, -changes in gait Ophthalmology: denies vision changes, eye redness, itching, discharge Urology: -burning with urination, -difficulty urinating, -blood in urine, -urinary frequency, -urgency, -incontinence Neurology: -headache, -weakness, -tingling, -numbness, -memory loss, -falls, -dizziness Psychology: -depressed mood, -agitation, -sleep problems Male GU: no testicular mass, pain, no lymph nodes swollen, no swelling, no rash.     06/19/2022    1:18 PM 08/08/2020    8:19 AM 08/02/2019    9:04 AM  Depression screen PHQ 2/9  Decreased Interest 0 0 0  Down, Depressed, Hopeless 0 0 0  PHQ - 2 Score 0 0 0        Objective:  BP (!) 124/92   Pulse 66   Temp 98 F (36.7 C) (Oral)   Resp 16   Ht '5\' 10"'$  (1.778 m)   Wt 244 lb (110.7 kg)   SpO2 97% Comment: room air  BMI 35.01 kg/m   Wt Readings from Last 3 Encounters:  06/19/22 244 lb (110.7 kg)  08/08/20 225 lb 3.2 oz (102.2 kg)  08/02/19 242 lb 8 oz (110 kg)   BP Readings from Last 3 Encounters:  06/19/22 (!) 124/92  08/08/20 110/70  08/02/19 124/88    General appearance: alert, no distress, WD/WN, African American male, muscular build Skin: unremarkable HEENT: normocephalic, conjunctiva/corneas normal, sclerae anicteric, PERRLA, EOMi, nares patent, no discharge or erythema, pharynx normal Oral cavity: MMM, tongue normal, teeth normal Neck: supple, no lymphadenopathy, no thyromegaly, no masses, normal ROM, no bruits Chest: non tender, normal shape and expansion Heart: RRR, normal S1, S2, no murmurs Lungs: CTA bilaterally,  no wheezes, rhonchi, or rales Abdomen: +bs, soft, non tender, non distended, no masses, no hepatomegaly, no splenomegaly, no bruits Back: non tender, normal ROM, no scoliosis Musculoskeletal: upper extremities non tender, no obvious deformity, normal ROM throughout, lower extremities non tender, no obvious deformity, normal ROM throughout Extremities: no  edema, no cyanosis, no clubbing Pulses: 2+ symmetric, upper and lower extremities, normal cap refill Neurological: alert, oriented x 3, CN2-12 intact, strength normal upper extremities and lower extremities, sensation normal throughout, DTRs 2+ throughout, no cerebellar signs, gait normal Psychiatric: normal affect, behavior normal, pleasant  GU: normal male external genitalia,circumcised, nontender, no masses, no hernia, no lymphadenopathy Rectal: deferred  EKG reviewed    Assessment and Plan :   Encounter Diagnoses  Name Primary?   Routine general medical examination at a health care facility Yes   Screening for lipid disorders    Screening for hematuria or proteinuria    Other fatigue    Family history of premature coronary heart disease     This visit was a preventative care visit, also known as wellness visit or routine physical.   Topics typically include healthy lifestyle, diet, exercise, preventative care, vaccinations, sick and well care, proper use of emergency dept and after hours care, as well as other concerns.     Recommendations: Continue to return yearly for your annual wellness and preventative care visits.  This gives [place] a chance to discuss healthy lifestyle, exercise, vaccinations, review your chart record, and perform screenings where appropriate.  I recommend you see your eye doctor yearly for routine vision care.  I recommend you see your dentist yearly for routine dental care including hygiene visits twice yearly.   Vaccination recommendations were reviewed Immunization History  Administered Date(s) Administered   Tdap 08/08/2020   You decline flu shot   Screening for cancer: Colon cancer screening: Age 20  Testicular cancer screening You should do a monthly self testicular exam if you are between 71-42 years old  We discussed PSA, prostate exam, and prostate cancer screening risks/benefits.   Age 32  Skin cancer screening: Check your skin  regularly for new changes, growing lesions, or other lesions of concern Come in for evaluation if you have skin lesions of concern.  Lung cancer screening: If you have a greater than 20 pack year history of tobacco use, then you may qualify for lung cancer screening with a chest CT scan.   Please call your insurance company to inquire about coverage for this test.  We currently don't have screenings for other cancers besides breast, cervical, colon, and lung cancers.  If you have a strong family history of cancer or have other cancer screening concerns, please let me know.    Bone health: Get at least 150 minutes of aerobic exercise weekly Get weight bearing exercise at least once weekly Bone density test:  A bone density test is an imaging test that uses a type of X-ray to measure the amount of calcium and other minerals in your bones. The test may be used to diagnose or screen you for a condition that causes weak or thin bones (osteoporosis), predict your risk for a broken bone (fracture), or determine how well your osteoporosis treatment is working. The bone density test is recommended for females 44 and older, or females or males <28 if certain risk factors such as thyroid disease, long term use of steroids such as for asthma or rheumatological issues, vitamin D deficiency, estrogen deficiency, family history of osteoporosis, self or family  history of fragility fracture in first degree relative.    Heart health: Get at least 150 minutes of aerobic exercise weekly Limit alcohol It is important to maintain a healthy blood pressure and healthy cholesterol numbers  Heart disease screening: Screening for heart disease includes screening for blood pressure, fasting lipids, glucose/diabetes screening, BMI height to weight ratio, reviewed of smoking status, physical activity, and diet.    Goals include blood pressure 120/80 or less, maintaining a healthy lipid/cholesterol profile, preventing  diabetes or keeping diabetes numbers under good control, not smoking or using tobacco products, exercising most days per week or at least 150 minutes per week of exercise, and eating healthy variety of fruits and vegetables, healthy oils, and avoiding unhealthy food choices like fried food, fast food, high sugar and high cholesterol foods.    Other tests may possibly include EKG test, CT coronary calcium score, echocardiogram, exercise treadmill stress test.     Medical care options: I recommend you continue to seek care here first for routine care.  We try really hard to have available appointments Monday through Friday daytime hours for sick visits, acute visits, and physicals.  Urgent care should be used for after hours and weekends for significant issues that cannot wait till the next day.  The emergency department should be used for significant potentially life-threatening emergencies.  The emergency department is expensive, can often have long wait times for less significant concerns, so try to utilize primary care, urgent care, or telemedicine when possible to avoid unnecessary trips to the emergency department.  Virtual visits and telemedicine have been introduced since the pandemic started in 2020, and can be convenient ways to receive medical care.  We offer virtual appointments as well to assist you in a variety of options to seek medical care.   Advanced Directives: I recommend you consider completing a [place] and Living Will.   These documents respect your wishes and help alleviate burdens on your loved ones if you were to become terminally ill or be in a position to need those documents enforced.    You can complete Advanced Directives yourself, have them notarized, then have copies made for our office, for you and for anybody you feel should have them in safe keeping.  Or, you can have an attorney prepare these documents.   If you haven't updated your Last Will  and Testament in a while, it may be worthwhile having an attorney prepare these documents together and save on some costs.       Separate significant issues discussed: Fatigue - labs  Family history of premature cardiac disease - baseline EKG today   Jamaree was seen today for annual exam.  Diagnoses and all orders for this visit:  Routine general medical examination at a health care facility -     POCT Urinalysis DIP (Proadvantage Device) -     Comprehensive metabolic panel -     CBC with Differential/Platelet -     Lipid panel -     Testosterone -     EKG 12-Lead  Screening for lipid disorders -     Lipid panel  Screening for hematuria or proteinuria  Other fatigue -     Testosterone  Family history of premature coronary heart disease -     EKG 12-Lead   Follow-up pending labs, yearly for physical

## 2022-06-20 LAB — COMPREHENSIVE METABOLIC PANEL
ALT: 113 IU/L — ABNORMAL HIGH (ref 0–44)
AST: 66 IU/L — ABNORMAL HIGH (ref 0–40)
Albumin/Globulin Ratio: 1.7 (ref 1.2–2.2)
Albumin: 4.5 g/dL (ref 4.1–5.1)
Alkaline Phosphatase: 59 IU/L (ref 44–121)
BUN/Creatinine Ratio: 10 (ref 9–20)
BUN: 14 mg/dL (ref 6–20)
Bilirubin Total: 0.4 mg/dL (ref 0.0–1.2)
CO2: 25 mmol/L (ref 20–29)
Calcium: 9.3 mg/dL (ref 8.7–10.2)
Chloride: 99 mmol/L (ref 96–106)
Creatinine, Ser: 1.36 mg/dL — ABNORMAL HIGH (ref 0.76–1.27)
Globulin, Total: 2.6 g/dL (ref 1.5–4.5)
Glucose: 80 mg/dL (ref 70–99)
Potassium: 4.6 mmol/L (ref 3.5–5.2)
Sodium: 137 mmol/L (ref 134–144)
Total Protein: 7.1 g/dL (ref 6.0–8.5)
eGFR: 70 mL/min/{1.73_m2} (ref >59)

## 2022-06-20 LAB — CBC WITH DIFFERENTIAL/PLATELET
Basophils Absolute: 0 10*3/uL (ref 0.0–0.2)
Basos: 1 %
EOS (ABSOLUTE): 0 10*3/uL (ref 0.0–0.4)
Eos: 0 %
Hematocrit: 48.2 % (ref 37.5–51.0)
Hemoglobin: 16.1 g/dL (ref 13.0–17.7)
Immature Grans (Abs): 0 10*3/uL (ref 0.0–0.1)
Immature Granulocytes: 0 %
Lymphocytes Absolute: 2.6 10*3/uL (ref 0.7–3.1)
Lymphs: 46 %
MCH: 26.8 pg (ref 26.6–33.0)
MCHC: 33.4 g/dL (ref 31.5–35.7)
MCV: 80 fL (ref 79–97)
Monocytes Absolute: 0.5 10*3/uL (ref 0.1–0.9)
Monocytes: 9 %
Neutrophils Absolute: 2.5 10*3/uL (ref 1.4–7.0)
Neutrophils: 44 %
Platelets: 291 10*3/uL (ref 150–450)
RBC: 6.01 x10E6/uL — ABNORMAL HIGH (ref 4.14–5.80)
RDW: 13.9 % (ref 11.6–15.4)
WBC: 5.6 10*3/uL (ref 3.4–10.8)

## 2022-06-20 LAB — LIPID PANEL
Chol/HDL Ratio: 3.5 ratio (ref 0.0–5.0)
Cholesterol, Total: 190 mg/dL (ref 100–199)
HDL: 55 mg/dL (ref >39)
LDL Chol Calc (NIH): 125 mg/dL — ABNORMAL HIGH (ref 0–99)
Triglycerides: 51 mg/dL (ref 0–149)
VLDL Cholesterol Cal: 10 mg/dL (ref 5–40)

## 2022-06-20 LAB — TESTOSTERONE: Testosterone: 652 ng/dL (ref 264–916)

## 2022-06-23 NOTE — Progress Notes (Signed)
Labs show elevated liver tests, and kidney marker slightly elevated.   Electrolytes normal. Blood counts ok except red cells slightly elevated. Cholesterol is ok, testosterone male hormone normal.  Since urine showed no protein, the kidney marker being elevated is light not a concern  Lets see if Bryan Benjamin in lab can add iron level.     He had elevated liver tests in 2020 and liver tests then showed no hepatitis infection.  Any concern for new hepatitis infection exposure?  If not, we will check iron level and go from there.   Any recent alcohol use or frequent use of tylenol, ibuprofen or other OTC supplements?

## 2022-06-24 ENCOUNTER — Other Ambulatory Visit: Payer: Self-pay | Admitting: Medical

## 2022-06-24 DIAGNOSIS — R7989 Other specified abnormal findings of blood chemistry: Secondary | ICD-10-CM

## 2022-06-24 LAB — SPECIMEN STATUS REPORT

## 2022-06-24 LAB — IRON: Iron: 103 ug/dL (ref 38–169)

## 2022-06-24 NOTE — Progress Notes (Signed)
Iron level was normal, see other message from today

## 2023-03-29 ENCOUNTER — Encounter (HOSPITAL_BASED_OUTPATIENT_CLINIC_OR_DEPARTMENT_OTHER): Payer: Self-pay | Admitting: Family Medicine

## 2023-03-29 ENCOUNTER — Ambulatory Visit (HOSPITAL_BASED_OUTPATIENT_CLINIC_OR_DEPARTMENT_OTHER): Payer: 59 | Admitting: Family Medicine

## 2023-03-29 VITALS — BP 120/86 | HR 76 | Ht 72.0 in | Wt 255.8 lb

## 2023-03-29 DIAGNOSIS — Z0000 Encounter for general adult medical examination without abnormal findings: Secondary | ICD-10-CM | POA: Diagnosis not present

## 2023-03-29 NOTE — Assessment & Plan Note (Signed)
Routine HCM labs ordered. HCM reviewed/discussed. Anticipatory guidance regarding healthy weight, lifestyle and choices given. Recommend healthy diet.  Recommend approximately 150 minutes/week of moderate intensity exercise Recommend regular dental and vision exams Always use seatbelt/lap and shoulder restraints Recommend using smoke alarms and checking batteries at least twice a year Recommend using sunscreen when outside Discussed tetanus immunization recommendations, patient UTD

## 2023-03-29 NOTE — Progress Notes (Signed)
New Patient Office Visit  Subjective    Patient ID: Bryan Benjamin, male    DOB: 11/27/1990  Age: 32 y.o. MRN: [id number]  CC:  Chief Complaint  Patient presents with   New Patient (Initial Visit)    New patient would like physical done if possible needs forms filled out for work     HPI Bryan Benjamin presents to establish care Last PCP - Crosby Oyster  Denies any chronic medical issues Review of chart: Has had prior lab abnormalities including elevated LDL, transaminitis, slightly elevated creatinine.  No specific concerns related to these from prior PCP.  Patient has primarily been utilizing lifestyle modifications and monitoring.  FH of diabetes, heart disease  Patient is originally from [place], [place]. Has lived here for about 10 years. He works as Merchandiser, retail, Software engineer. Outside of work he enjoys working out, Orthoptist, spending time with family.   No outpatient encounter medications on file as of 03/29/2023.   No facility-administered encounter medications on file as of 03/29/2023.    Past Medical History:  Diagnosis Date   Arthritis    Asthma    History of Bell's palsy     Past Surgical History:  Procedure Laterality Date   NO PAST SURGERIES  05/2022    Family History  Problem Relation Age of Onset   Stroke Mother 20   Depression Mother    Diabetes Mother    Heart disease Mother 68       stents, MI   Diabetes Father    Heart disease Maternal Grandmother    Cancer Neg Hx     Social History   Socioeconomic History   Marital status: Married    Spouse name: Not on file   Number of children: Not on file   Years of education: Not on file   Highest education level: Not on file  Occupational History   Not on file  Tobacco Use   Smoking status: Never   Smokeless tobacco: Never   Tobacco comments:    marijuana   Substance and Sexual Activity   Alcohol use: Yes    Comment: rare    Drug use: Not Currently    Types: Marijuana    Comment: 1-2 times  per day    Sexual activity: Yes    Partners: Female  Other Topics Concern   Not on file  Social History Narrative   Lives with wife and son.   Works in Consulting civil engineer, Biochemist, clinical.    Exercise - weights, swim, run, yard work.   05/2022.   Social Determinants of Health   Financial Resource Strain: Low Risk  (03/29/2023)   Overall Financial Resource Strain (CARDIA)    Difficulty of Paying Living Expenses: Not hard at all  Food Insecurity: No Food Insecurity (03/29/2023)   Hunger Vital Sign    Worried About Running Out of Food in the Last Year: Never true    Ran Out of Food in the Last Year: Never true  Transportation Needs: No Transportation Needs (03/29/2023)   PRAPARE - Administrator, Civil Service (Medical): No    Lack of Transportation (Non-Medical): No  Physical Activity: Sufficiently Active (03/29/2023)   Exercise Vital Sign    Days of Exercise per Week: 6 days    Minutes of Exercise per Session: 40 min  Stress: No Stress Concern Present (03/29/2023)   [employer] of Occupational Health - Occupational Stress Questionnaire    Feeling of Stress : Not at all  Social Connections: Moderately Isolated (03/29/2023)   Social Connection and Isolation Panel [NHANES]    Frequency of Communication with Friends and Family: More than three times a week    Frequency of Social Gatherings with Friends and Family: More than three times a week    Attends Religious Services: Never    Database administrator or Organizations: No    Attends Banker Meetings: Never    Marital Status: Married  Catering manager Violence: Not At Risk (03/29/2023)   Humiliation, Afraid, Rape, and Kick questionnaire    Fear of Current or Ex-Partner: No    Emotionally Abused: No    Physically Abused: No    Sexually Abused: No    Objective    BP 120/86 (BP Location: Left Arm, Patient Position: Sitting, Cuff Size: Normal)   Pulse 76   Ht 6' (1.829 m)   Wt 255 lb 12.8 oz (116 kg)   SpO2 98%   BMI  34.69 kg/m   Physical Exam Constitutional:      General: He is not in acute distress.    Appearance: He is obese.  HENT:     Head: Normocephalic and atraumatic.     Right Ear: Tympanic membrane, ear canal and external ear normal.     Left Ear: Tympanic membrane, ear canal and external ear normal.     Nose: Nose normal.     Mouth/Throat:     Mouth: Mucous membranes are moist.     Pharynx: Oropharynx is clear.  Eyes:     Extraocular Movements: Extraocular movements intact.     Conjunctiva/sclera: Conjunctivae normal.     Pupils: Pupils are equal, round, and reactive to light.  Cardiovascular:     Rate and Rhythm: Normal rate and regular rhythm.     Pulses: Normal pulses.     Heart sounds: Normal heart sounds. No murmur heard. Pulmonary:     Effort: Pulmonary effort is normal.     Breath sounds: Normal breath sounds. No wheezing.  Abdominal:     General: Bowel sounds are normal. There is no distension.     Palpations: Abdomen is soft.     Tenderness: There is no abdominal tenderness. There is no guarding.  Musculoskeletal:     Cervical back: Normal range of motion. No tenderness.  Skin:    General: Skin is warm.     Capillary Refill: Capillary refill takes less than 2 seconds.     Coloration: Skin is not jaundiced.     Findings: No rash.  Neurological:     General: No focal deficit present.     Mental Status: He is alert and oriented to person, place, and time.     Gait: Gait normal.  Psychiatric:        Mood and Affect: Mood normal.        Behavior: Behavior normal.    Assessment & Plan:   Wellness examination Assessment & Plan: Routine HCM labs ordered. HCM reviewed/discussed. Anticipatory guidance regarding healthy weight, lifestyle and choices given. Recommend healthy diet.  Recommend approximately 150 minutes/week of moderate intensity exercise Recommend regular dental and vision exams Always use seatbelt/lap and shoulder restraints Recommend using smoke alarms  and checking batteries at least twice a year Recommend using sunscreen when outside Discussed tetanus immunization recommendations, patient UTD  Orders: -     CBC with Differential/Platelet -     Comprehensive metabolic panel -     Hemoglobin A1c -     Lipid panel -  TSH Rfx on Abnormal to Free T4  Return in about 1 year (around 03/28/2024).  Or sooner as indicated by labs or if any new issues do arise   ___________________________________________ Keonta Monceaux de [place], MD, ABFM, CAQSM Primary Care and Sports Medicine [facility]

## 2023-03-30 LAB — CBC WITH DIFFERENTIAL/PLATELET
Basophils Absolute: 0 10*3/uL (ref 0.0–0.2)
Basos: 1 %
EOS (ABSOLUTE): 0.1 10*3/uL (ref 0.0–0.4)
Eos: 1 %
Hematocrit: 49.6 % (ref 37.5–51.0)
Hemoglobin: 15.8 g/dL (ref 13.0–17.7)
Immature Grans (Abs): 0 10*3/uL (ref 0.0–0.1)
Immature Granulocytes: 0 %
Lymphocytes Absolute: 2.1 10*3/uL (ref 0.7–3.1)
Lymphs: 46 %
MCH: 26.9 pg (ref 26.6–33.0)
MCHC: 31.9 g/dL (ref 31.5–35.7)
MCV: 84 fL (ref 79–97)
Monocytes Absolute: 0.3 10*3/uL (ref 0.1–0.9)
Monocytes: 7 %
Neutrophils Absolute: 2.1 10*3/uL (ref 1.4–7.0)
Neutrophils: 45 %
Platelets: 288 10*3/uL (ref 150–450)
RBC: 5.88 x10E6/uL — ABNORMAL HIGH (ref 4.14–5.80)
RDW: 14.5 % (ref 11.6–15.4)
WBC: 4.6 10*3/uL (ref 3.4–10.8)

## 2023-03-30 LAB — COMPREHENSIVE METABOLIC PANEL
ALT: 39 [IU]/L (ref 0–44)
AST: 25 [IU]/L (ref 0–40)
Albumin: 4.5 g/dL (ref 4.1–5.1)
Alkaline Phosphatase: 64 [IU]/L (ref 44–121)
BUN/Creatinine Ratio: 8 — ABNORMAL LOW (ref 9–20)
BUN: 11 mg/dL (ref 6–20)
Bilirubin Total: 0.3 mg/dL (ref 0.0–1.2)
CO2: 25 mmol/L (ref 20–29)
Calcium: 9.3 mg/dL (ref 8.7–10.2)
Chloride: 101 mmol/L (ref 96–106)
Creatinine, Ser: 1.4 mg/dL — ABNORMAL HIGH (ref 0.76–1.27)
Globulin, Total: 2.4 g/dL (ref 1.5–4.5)
Glucose: 98 mg/dL (ref 70–99)
Potassium: 4.6 mmol/L (ref 3.5–5.2)
Sodium: 138 mmol/L (ref 134–144)
Total Protein: 6.9 g/dL (ref 6.0–8.5)
eGFR: 68 mL/min/{1.73_m2} (ref >59)

## 2023-03-30 LAB — LIPID PANEL
Chol/HDL Ratio: 4.2 {ratio} (ref 0.0–5.0)
Cholesterol, Total: 200 mg/dL — ABNORMAL HIGH (ref 100–199)
HDL: 48 mg/dL (ref >39)
LDL Chol Calc (NIH): 136 mg/dL — ABNORMAL HIGH (ref 0–99)
Triglycerides: 89 mg/dL (ref 0–149)
VLDL Cholesterol Cal: 16 mg/dL (ref 5–40)

## 2023-03-30 LAB — HEMOGLOBIN A1C
Est. average glucose Bld gHb Est-mCnc: 126 mg/dL
Hgb A1c MFr Bld: 6 % — ABNORMAL HIGH (ref 4.8–5.6)

## 2023-03-30 LAB — TSH RFX ON ABNORMAL TO FREE T4: TSH: 2.59 u[IU]/mL (ref 0.450–4.500)

## 2023-08-27 ENCOUNTER — Encounter (HOSPITAL_BASED_OUTPATIENT_CLINIC_OR_DEPARTMENT_OTHER): Payer: Self-pay | Admitting: *Deleted

## 2023-08-27 ENCOUNTER — Emergency Department (HOSPITAL_BASED_OUTPATIENT_CLINIC_OR_DEPARTMENT_OTHER)
Admission: EM | Admit: 2023-08-27 | Discharge: 2023-08-27 | Disposition: A | Discharge status: Home / Self Care | Payer: 59 | Attending: Emergency Medicine | Admitting: Emergency Medicine

## 2023-08-27 ENCOUNTER — Other Ambulatory Visit: Payer: Self-pay

## 2023-08-27 DIAGNOSIS — J45909 Unspecified asthma, uncomplicated: Secondary | ICD-10-CM | POA: Diagnosis not present

## 2023-08-27 DIAGNOSIS — J101 Influenza due to other identified influenza virus with other respiratory manifestations: Secondary | ICD-10-CM | POA: Insufficient documentation

## 2023-08-27 DIAGNOSIS — J069 Acute upper respiratory infection, unspecified: Secondary | ICD-10-CM | POA: Diagnosis present

## 2023-08-27 LAB — RESP PANEL BY RT-PCR (RSV, FLU A&B, COVID)  RVPGX2
Influenza A by PCR: POSITIVE — AB
Influenza B by PCR: NEGATIVE
Resp Syncytial Virus by PCR: NEGATIVE
SARS Coronavirus 2 by RT PCR: NEGATIVE

## 2023-08-27 LAB — GROUP A STREP BY PCR: Group A Strep by PCR: NOT DETECTED

## 2023-08-27 NOTE — ED Notes (Signed)
 Reviewed AVS/discharge instruction with patient. Time allotted for and all questions answered. Patient is agreeable for d/c and escorted to ed exit by staff.

## 2023-08-27 NOTE — Discharge Instructions (Signed)
 You were seen today for influenza A.  Recommend that you isolate till fever free for 24 hours.  Continue to hydrate with lots of fluids as well as to continue to eat small meals.  Continue to take Tylenol and ibuprofen for pain relief.  Take Tylenol (acetominophen)  650mg  every 4-6 hours, as needed for pain or fever. Do not take more than 4,000 mg in a 24-hour period. As this may cause liver damage. While this is rare, if you begin to develop yellowing of the skin or eyes, stop taking and return to ER immediately.  Take Ibuprofen 400mg  every 4-6 hours for pain or fever, not exceeding 3,200 mg per day as more than 3,200mg  can cause Stomach irritation, dizziness, kidney issues with long-term use.  Return to ED for any new or worsening symptoms.  If symptoms persist longer than 7 days, follow-up PCP for further evaluation.

## 2023-08-27 NOTE — ED Provider Notes (Signed)
 [facility] AT [facility] Provider Note   CSN: [id number] Arrival date & time: 08/27/23  1331     History  Chief Complaint  Patient presents with   URI    Bryan Benjamin is a 33 y.o. male.   URI Presenting symptoms: congestion and cough   Patient is a 33 year old male presents the ED today complaining of a 1 day history of flulike symptoms including cough, congestion, fever, generalized abdominal pain.  Denies any sick contacts at home, work, school. Endorses headache Denies any vision changes, chest pain, shortness of breath, nausea, vomiting, diarrhea, lower leg swelling     Home Medications Prior to Admission medications   Not on File      Allergies    Patient has no known allergies.    Review of Systems   Review of Systems  HENT:  Positive for congestion.   Respiratory:  Positive for cough.   All other systems reviewed and are negative.   Physical Exam Updated Vital Signs BP 118/83 (BP Location: Right Arm)   Pulse 99   Temp 100 F (37.8 C) (Oral)   Resp 18   SpO2 97%  Physical Exam Vitals and nursing note reviewed.  Constitutional:      General: He is not in acute distress.    Appearance: Normal appearance. He is not ill-appearing.  HENT:     Head: Normocephalic and atraumatic.     Nose: Congestion present.     Mouth/Throat:     Mouth: Mucous membranes are moist.     Pharynx: Oropharynx is clear. No oropharyngeal exudate or posterior oropharyngeal erythema.  Eyes:     General: No scleral icterus.       Right eye: No discharge.        Left eye: No discharge.     Extraocular Movements: Extraocular movements intact.     Conjunctiva/sclera: Conjunctivae normal.  Neck:     Comments: Negative Brudzinski's sign and negative Kernig's sign. Cardiovascular:     Rate and Rhythm: Normal rate and regular rhythm.     Pulses: Normal pulses.     Heart sounds: Normal heart sounds. No murmur heard.    No friction rub. No gallop.   Pulmonary:     Effort: Pulmonary effort is normal. No respiratory distress.     Breath sounds: Normal breath sounds. No stridor. No wheezing.  Abdominal:     General: Abdomen is flat.     Palpations: Abdomen is soft.     Tenderness: There is abdominal tenderness (Generalized abdominal tenderness noted to palpation.).  Musculoskeletal:     Cervical back: Normal range of motion. No rigidity or tenderness.     Right lower leg: No edema.     Left lower leg: No edema.  Skin:    General: Skin is warm and dry.     Coloration: Skin is not jaundiced or pale.     Findings: No bruising, erythema or rash.  Neurological:     General: No focal deficit present.     Mental Status: He is alert and oriented to person, place, and time. Mental status is at baseline.     Motor: No weakness.  Psychiatric:        Mood and Affect: Mood normal.     ED Results / Procedures / Treatments   Labs (all labs ordered are listed, but only abnormal results are displayed) Labs Reviewed  RESP PANEL BY RT-PCR (RSV, FLU A&B, COVID)  RVPGX2 - Abnormal; Notable  for the following components:      Result Value   Influenza A by PCR POSITIVE (*)    All other components within normal limits  GROUP A STREP BY PCR    EKG None  Radiology No results found.  Procedures Procedures    Medications Ordered in ED Medications - No data to display  ED Course/ Medical Decision Making/ A&P                                 Medical Decision Making  This patient is a 33 year old male who presents to the ED for concern of flulike symptoms x 1 day.   Differential diagnoses prior to evaluation: The emergent differential diagnosis includes, but is not limited to, URI, pneumonia, bronchitis, PE, gastritis, gastroenteritis. This is not an exhaustive differential.   Past Medical History / Co-morbidities / Social History: Bell's palsy, asthma  Additional history: Chart reviewed. Pertinent results include:   Noted to be  last seen for elevated LFTs and kidney function in 2023.  Lab Tests/Imaging studies: I personally interpreted labs/imaging and the pertinent results include:   Respiratory panel positive for influenza A. Strep test negative   Medications: No medications necessary for labs visit.  I have reviewed the patients home medicines and have made adjustments as needed.  ED Course:   Patient is a 33 year old male presents the ED today complaining of a 1 day history of flulike symptoms including cough, congestion, fever, generalized abdominal pain.  Denies any sick contacts at home, work, school. Endorses headache Denies any vision changes, chest pain, shortness of breath, nausea, vomiting, diarrhea, lower leg swelling  Physical exam is notable for mild diaphoresis, generalized abdominal tenderness to palpation.  Exam is otherwise unremarkable, with no meningeal signs, negative Brudzinski's, negative Kernig sign.  Lungs LCTAB.  Low suspicion for any pneumonia, meningitis, other emergent process present this time.  Provided strict return to ED precautions.  I believe patient safe discharge at this time with continue to monitor symptoms and to isolate until fever free for 24 hours.  Patient expressed agreement understanding of plan.   Disposition: After consideration of the diagnostic results and the patients response to treatment, I feel that the patient would benefit from discharge and treatment as above.   emergency department workup does not suggest an emergent condition requiring admission or immediate intervention beyond what has been performed at this time. The plan is: Isolate until fever free for 24 hours, follow-up with PCP if symptoms still present in 1 week, return to ED for any new or worsening symptoms. The patient is safe for discharge and has been instructed to return immediately for worsening symptoms, change in symptoms or any other concerns.   Final Clinical Impression(s) / ED  Diagnoses Final diagnoses:  Influenza A    Rx / DC Orders ED Discharge Orders     None         Lavonia Drafts 08/27/23 1645    Rondel Baton, MD 08/30/23 [phone]

## 2023-08-27 NOTE — ED Triage Notes (Signed)
 Pt is here for sore throat, fever, body aches, chills that began at 3am

## 2024-03-29 ENCOUNTER — Ambulatory Visit (INDEPENDENT_AMBULATORY_CARE_PROVIDER_SITE_OTHER): Payer: 59 | Admitting: Family Medicine

## 2024-03-29 ENCOUNTER — Encounter (HOSPITAL_BASED_OUTPATIENT_CLINIC_OR_DEPARTMENT_OTHER): Payer: Self-pay | Admitting: Family Medicine

## 2024-03-29 VITALS — BP 125/77 | HR 67 | Ht 72.0 in | Wt 259.2 lb

## 2024-03-29 DIAGNOSIS — Z0000 Encounter for general adult medical examination without abnormal findings: Secondary | ICD-10-CM | POA: Diagnosis not present

## 2024-03-29 DIAGNOSIS — R5383 Other fatigue: Secondary | ICD-10-CM

## 2024-03-29 NOTE — Assessment & Plan Note (Signed)

## 2024-03-29 NOTE — Patient Instructions (Signed)
  Medication Instructions:  Your physician recommends that you continue on your current medications as directed. Please refer to the Current Medication list given to you today. --If you need a refill on any your medications before your next appointment, please call your pharmacy first. If no refills are authorized on file call the office.-- Lab Work: Your physician has recommended that you have lab work today: come back fasting for labs  If you have labs (blood work) drawn today and your tests are completely normal, you will receive your results via MyChart message OR a phone call from our staff.  Please ensure you check your voicemail in the event that you authorized detailed messages to be left on a delegated number. If you have any lab test that is abnormal or we need to change your treatment, we will call you to review the results.   Follow-Up: Your next appointment:   Your physician recommends that you schedule a follow-up appointment in: 1 year physical  with Dr. de [place]  You will receive a text message or e-mail with a link to a survey about your care and experience with us  today! We would greatly appreciate your feedback!   Thanks for letting us  be apart of your health journey!!  Primary Care and Sports Medicine   Dr. Quintin sheerer [place]   We encourage you to activate your patient portal called MyChart.  Sign up information is provided on this After Visit Summary.  MyChart is used to connect with patients for Virtual Visits (Telemedicine).  Patients are able to view lab/test results, encounter notes, upcoming appointments, etc.  Non-urgent messages can be sent to your provider as well. To learn more about what you can do with MyChart, please visit --  [email or web address].

## 2024-03-29 NOTE — Progress Notes (Signed)
 Subjective:    CC: Annual Physical Exam  HPI: Bryan Benjamin is a 33 y.o. presenting for annual physical  I reviewed the past medical history, family history, social history, surgical history, and allergies today and no changes were needed.  Please see the problem list section below in epic for further details.  Past Medical History: Past Medical History:  Diagnosis Date   Arthritis    Asthma    History of Bell's palsy    Past Surgical History: Past Surgical History:  Procedure Laterality Date   NO PAST SURGERIES  05/2022   Social History: Social History   Socioeconomic History   Marital status: Married    Spouse name: Not on file   Number of children: Not on file   Years of education: Not on file   Highest education level: Not on file  Occupational History   Not on file  Tobacco Use   Smoking status: Never   Smokeless tobacco: Never   Tobacco comments:    marijuana   Substance and Sexual Activity   Alcohol use: Yes    Comment: rare    Drug use: Not Currently    Types: Marijuana    Comment: 1-2 times per day    Sexual activity: Yes    Partners: Female  Other Topics Concern   Not on file  Social History Narrative   Lives with wife and son.   Works in Consulting civil engineer, Biochemist, clinical.    Exercise - weights, swim, run, yard work.   05/2022.   Social Drivers of Corporate investment banker Strain: Low Risk  (03/29/2023)   Overall Financial Resource Strain (CARDIA)    Difficulty of Paying Living Expenses: Not hard at all  Food Insecurity: No Food Insecurity (03/29/2023)   Hunger Vital Sign    Worried About Running Out of Food in the Last Year: Never true    Ran Out of Food in the Last Year: Never true  Transportation Needs: No Transportation Needs (03/29/2023)   PRAPARE - Administrator, Civil Service (Medical): No    Lack of Transportation (Non-Medical): No  Physical Activity: Sufficiently Active (03/29/2023)   Exercise Vital Sign    Days of Exercise per Week: 6 days     Minutes of Exercise per Session: 40 min  Stress: No Stress Concern Present (03/29/2023)   [employer] of Occupational Health - Occupational Stress Questionnaire    Feeling of Stress : Not at all  Social Connections: Moderately Isolated (03/29/2023)   Social Connection and Isolation Panel    Frequency of Communication with Friends and Family: More than three times a week    Frequency of Social Gatherings with Friends and Family: More than three times a week    Attends Religious Services: Never    Database administrator or Organizations: No    Attends Engineer, structural: Never    Marital Status: Married   Family History: Family History  Problem Relation Age of Onset   Stroke Mother 40   Depression Mother    Diabetes Mother    Heart disease Mother 40       stents, MI   Diabetes Father    Heart disease Maternal Grandmother    Cancer Neg Hx    Allergies: No Known Allergies Medications: See med rec.  Review of Systems: No headache, visual changes, nausea, vomiting, diarrhea, constipation, dizziness, abdominal pain, skin rash, fevers, chills, night sweats, swollen lymph nodes, weight loss, chest pain, body aches,  joint swelling, muscle aches, shortness of breath, mood changes, visual or auditory hallucinations.  Objective:    BP 125/77 (BP Location: Left Arm, Patient Position: Sitting, Cuff Size: Large)   Pulse 67   Ht 6' (1.829 m)   Wt 259 lb 3.2 oz (117.6 kg)   SpO2 96%   BMI 35.15 kg/m   General: Well Developed, well nourished, and in no acute distress. Neuro: Alert and oriented x3, extra-ocular muscles intact, sensation grossly intact. Cranial nerves II through XII are intact, motor, sensory, and coordinative functions are all intact. HEENT: Normocephalic, atraumatic, pupils equal round reactive to light, neck supple, no masses, no lymphadenopathy, thyroid nonpalpable. Oropharynx, nasopharynx, external ear canals are unremarkable. Skin: Warm and dry, no  rashes noted. Cardiac: Regular rate and rhythm, no murmurs rubs or gallops. Respiratory: Clear to auscultation bilaterally. Not using accessory muscles, speaking in full sentences. Abdominal: Soft, nontender, nondistended, positive bowel sounds, no masses, no organomegaly. Musculoskeletal: Shoulder, elbow, wrist, hip, knee, ankle stable, and with full range of motion.  Impression and Recommendations:    Wellness examination Assessment & Plan: Routine HCM labs ordered. HCM reviewed/discussed. Anticipatory guidance regarding healthy weight, lifestyle and choices given. Recommend healthy diet.  Recommend approximately 150 minutes/week of moderate intensity exercise Recommend regular dental and vision exams Always use seatbelt/lap and shoulder restraints Recommend using smoke alarms and checking batteries at least twice a year Recommend using sunscreen when outside Discussed immunization recommendations  Orders: -     CBC with Differential/Platelet; Future -     Comprehensive metabolic panel with GFR; Future -     Hemoglobin A1c; Future -     Lipid panel; Future -     TSH Rfx on Abnormal to Free T4; Future  Other fatigue -     Testosterone ; Future  Patient inquiring about checking testosterone  primarily due to fatigue at times. No current issues with decreased libido or erectile dysfunction.  Return in about 1 year (around 03/29/2025) for CPE.   ___________________________________________ Ward Boissonneault de [place], MD, ABFM, [facility] Primary Care and Sports Medicine [facility]

## 2024-03-31 ENCOUNTER — Ambulatory Visit (HOSPITAL_BASED_OUTPATIENT_CLINIC_OR_DEPARTMENT_OTHER)

## 2024-03-31 ENCOUNTER — Other Ambulatory Visit (HOSPITAL_BASED_OUTPATIENT_CLINIC_OR_DEPARTMENT_OTHER): Payer: Self-pay | Admitting: *Deleted

## 2024-03-31 DIAGNOSIS — R5383 Other fatigue: Secondary | ICD-10-CM

## 2024-03-31 DIAGNOSIS — Z0000 Encounter for general adult medical examination without abnormal findings: Secondary | ICD-10-CM

## 2024-04-01 LAB — CBC WITH DIFFERENTIAL/PLATELET
Basophils Absolute: 0 x10E3/uL (ref 0.0–0.2)
Basos: 1 %
EOS (ABSOLUTE): 0 x10E3/uL (ref 0.0–0.4)
Eos: 1 %
Hematocrit: 48.1 % (ref 37.5–51.0)
Hemoglobin: 15.2 g/dL (ref 13.0–17.7)
Immature Grans (Abs): 0 x10E3/uL (ref 0.0–0.1)
Immature Granulocytes: 0 %
Lymphocytes Absolute: 2.2 x10E3/uL (ref 0.7–3.1)
Lymphs: 42 %
MCH: 26.9 pg (ref 26.6–33.0)
MCHC: 31.6 g/dL (ref 31.5–35.7)
MCV: 85 fL (ref 79–97)
Monocytes Absolute: 0.5 x10E3/uL (ref 0.1–0.9)
Monocytes: 9 %
Neutrophils Absolute: 2.5 x10E3/uL (ref 1.4–7.0)
Neutrophils: 47 %
Platelets: 273 x10E3/uL (ref 150–450)
RBC: 5.66 x10E6/uL (ref 4.14–5.80)
RDW: 15.3 % (ref 11.6–15.4)
WBC: 5.3 x10E3/uL (ref 3.4–10.8)

## 2024-04-01 LAB — COMPREHENSIVE METABOLIC PANEL WITH GFR
ALT: 38 IU/L (ref 0–44)
AST: 28 IU/L (ref 0–40)
Albumin: 4.1 g/dL (ref 4.1–5.1)
Alkaline Phosphatase: 56 IU/L (ref 47–123)
BUN/Creatinine Ratio: 11 (ref 9–20)
BUN: 15 mg/dL (ref 6–20)
Bilirubin Total: 0.4 mg/dL (ref 0.0–1.2)
CO2: 23 mmol/L (ref 20–29)
Calcium: 9.2 mg/dL (ref 8.7–10.2)
Chloride: 104 mmol/L (ref 96–106)
Creatinine, Ser: 1.32 mg/dL — ABNORMAL HIGH (ref 0.76–1.27)
Globulin, Total: 2.4 g/dL (ref 1.5–4.5)
Glucose: 85 mg/dL (ref 70–99)
Potassium: 4.4 mmol/L (ref 3.5–5.2)
Sodium: 141 mmol/L (ref 134–144)
Total Protein: 6.5 g/dL (ref 6.0–8.5)
eGFR: 72 mL/min/1.73 (ref >59)

## 2024-04-01 LAB — HEMOGLOBIN A1C
Est. average glucose Bld gHb Est-mCnc: 120 mg/dL
Hgb A1c MFr Bld: 5.8 % — ABNORMAL HIGH (ref 4.8–5.6)

## 2024-04-01 LAB — TESTOSTERONE: Testosterone: 539 ng/dL (ref 264–916)

## 2024-04-01 LAB — LIPID PANEL
Chol/HDL Ratio: 4.3 ratio (ref 0.0–5.0)
Cholesterol, Total: 191 mg/dL (ref 100–199)
HDL: 44 mg/dL (ref >39)
LDL Chol Calc (NIH): 130 mg/dL — ABNORMAL HIGH (ref 0–99)
Triglycerides: 91 mg/dL (ref 0–149)
VLDL Cholesterol Cal: 17 mg/dL (ref 5–40)

## 2024-04-01 LAB — TSH RFX ON ABNORMAL TO FREE T4: TSH: 2.28 u[IU]/mL (ref 0.450–4.500)

## 2024-04-03 ENCOUNTER — Ambulatory Visit (HOSPITAL_BASED_OUTPATIENT_CLINIC_OR_DEPARTMENT_OTHER): Payer: Self-pay | Admitting: Family Medicine

## 2025-03-30 ENCOUNTER — Encounter (HOSPITAL_BASED_OUTPATIENT_CLINIC_OR_DEPARTMENT_OTHER): Admitting: Family Medicine
